# Patient Record
Sex: Male | Born: 1945 | Race: Black or African American | Hispanic: No | Marital: Single | Smoking: Current every day smoker
Health system: Southern US, Community
[De-identification: ages and names within clinical notes are randomized; demographics above are authoritative.]

## PROBLEM LIST (undated history)

## (undated) DIAGNOSIS — K219 Gastro-esophageal reflux disease without esophagitis: Secondary | ICD-10-CM

## (undated) DIAGNOSIS — E785 Hyperlipidemia, unspecified: Secondary | ICD-10-CM

## (undated) DIAGNOSIS — I10 Essential (primary) hypertension: Secondary | ICD-10-CM

## (undated) DIAGNOSIS — I2510 Atherosclerotic heart disease of native coronary artery without angina pectoris: Secondary | ICD-10-CM

## (undated) HISTORY — PX: CHOLECYSTECTOMY: SHX55

## (undated) HISTORY — PX: SOFT TISSUE CYST EXCISION: SHX2418

---

## 2001-08-04 ENCOUNTER — Other Ambulatory Visit: Admission: RE | Admit: 2001-08-04 | Discharge: 2001-08-04 | Payer: Self-pay | Admitting: General Surgery

## 2001-09-21 ENCOUNTER — Other Ambulatory Visit: Admission: RE | Admit: 2001-09-21 | Discharge: 2001-09-21 | Payer: Self-pay | Admitting: General Surgery

## 2003-03-30 DIAGNOSIS — I2510 Atherosclerotic heart disease of native coronary artery without angina pectoris: Secondary | ICD-10-CM

## 2003-03-30 HISTORY — DX: Atherosclerotic heart disease of native coronary artery without angina pectoris: I25.10

## 2005-02-16 ENCOUNTER — Ambulatory Visit (HOSPITAL_COMMUNITY): Admission: RE | Admit: 2005-02-16 | Discharge: 2005-02-16 | Payer: Self-pay | Admitting: Internal Medicine

## 2005-02-16 ENCOUNTER — Encounter (INDEPENDENT_AMBULATORY_CARE_PROVIDER_SITE_OTHER): Payer: Self-pay | Admitting: Internal Medicine

## 2005-02-16 ENCOUNTER — Ambulatory Visit: Payer: Self-pay | Admitting: Internal Medicine

## 2006-03-29 HISTORY — PX: CORONARY ARTERY BYPASS GRAFT: SHX141

## 2006-05-17 ENCOUNTER — Ambulatory Visit: Payer: Self-pay | Admitting: Cardiovascular Disease

## 2006-05-17 ENCOUNTER — Encounter: Payer: Self-pay | Admitting: Cardiology

## 2006-05-17 ENCOUNTER — Encounter: Payer: Self-pay | Admitting: Emergency Medicine

## 2006-05-17 ENCOUNTER — Inpatient Hospital Stay (HOSPITAL_COMMUNITY): Admission: EM | Admit: 2006-05-17 | Discharge: 2006-05-25 | Payer: Self-pay | Admitting: Cardiovascular Disease

## 2006-05-18 ENCOUNTER — Ambulatory Visit: Payer: Self-pay | Admitting: Thoracic Surgery (Cardiothoracic Vascular Surgery)

## 2006-05-18 ENCOUNTER — Encounter: Payer: Self-pay | Admitting: Vascular Surgery

## 2006-06-06 ENCOUNTER — Ambulatory Visit: Payer: Self-pay | Admitting: Cardiology

## 2006-06-21 ENCOUNTER — Encounter (HOSPITAL_COMMUNITY): Admission: RE | Admit: 2006-06-21 | Discharge: 2006-07-21 | Payer: Self-pay | Admitting: Cardiology

## 2006-07-22 ENCOUNTER — Encounter (HOSPITAL_COMMUNITY): Admission: RE | Admit: 2006-07-22 | Discharge: 2006-08-21 | Payer: Self-pay | Admitting: Cardiology

## 2006-08-09 ENCOUNTER — Ambulatory Visit: Payer: Self-pay | Admitting: Cardiology

## 2006-08-23 ENCOUNTER — Encounter (HOSPITAL_COMMUNITY): Admission: RE | Admit: 2006-08-23 | Discharge: 2006-09-22 | Payer: Self-pay | Admitting: Cardiology

## 2007-04-18 ENCOUNTER — Ambulatory Visit: Payer: Self-pay | Admitting: Cardiology

## 2010-08-11 NOTE — Letter (Signed)
Aug 09, 2006    [name], MD  [address]  [place], [place] [address]   RE:  [name], [name]  MRN:  [id number]  /  DOB:  Jan 09, 1946   Dear [name]:   [name] returns to the office now 3 months following CABG surgery.  He  continues to do quite well, achieving good performance in cardiac  rehabilitation.  He reports minimal soreness at the upper part of his  incision.  Otherwise, he has no complaints.  He has been eating well and  gaining weight.   CURRENT MEDICATIONS:  1. Aspirin 81 mg daily.  2. Diazide 37.5/25 mg daily.  3. Pepcid 20 mg daily.  4. Metoprolol 25 mg b.i.d.  5. Atorvastatin 80 mg daily.   EXAMINATION:  Trim, pleasant gentleman in no acute distress.  The weight  is 165, eighteen pounds more than in March.  Blood pressure 135/70,  heart rate 64 with a sinus arrhythmia.  NECK:  No jugular venous distention; no carotid bruits.  LUNGS:  Clear.  THORAX:  Well-healed incision with stable sternum.  CARDIAC:  Normal first and second heart sounds; minimal systolic  ejection murmur.  EXTREMITIES:  No edema.   Laboratory obtained in March was good with mild anemia.  Hemoglobin was  3.5 with a hematocrit of 32.1.  Metabolic profile was normal.  Lipid  profile was excellent with total cholesterol of 99, HDL of 24, and LDL  of 53.   IMPRESSION:  [name] is doing quite well at present.  He is not required  to lift any significant amount of weight at work.  I have cleared him to  return on a full-time basis.  He will continue to refrain from cigarette  smoking and return to see me in 6 months.  We will recheck a chemistry  profile and CBC in 3 months.    Sincerely,      Gerrit Friends. Dietrich Pates, MD, [facility]  Electronically Signed    RMR/MedQ  DD: 08/09/2006  DT: 08/09/2006  Job #: [id number]

## 2010-08-11 NOTE — Letter (Signed)
April 18, 2007    [name], MD  [address]  [place],  [place] [address]   RE:  DIARRA, KOS  MRN:  [id number]  /  DOB:  November 20, 1945   Dear Ninetta Lights:   Mr. Wade returns to the office as scheduled for continued assessment and  treatment of cardiovascular risk factors.  Unfortunately, he has resume  cigarette smoking.  Otherwise, he is taking his appropriate medications  and doing well.  He reports no emergency department visits nor  hospitalizations.  He does experience a mild pulling sensation in his  sternal region with certain movements of his arms, but no chest  discomfort that sounds worrisome.   On exam, pleasant gentleman in no acute distress.  The weight is 157, 8  pounds less than in May.  Afebrile.  HEENT:  Anicteric sclerae; normal  lids and conjunctivae.  NECK:  No jugular venous distention; minimal left carotid bruit.  LUNGS:  Decreased breath sounds at the bases.  CARDIAC:  Normal first and second heart sounds; prominent fourth heart  sound.  ABDOMEN:  Soft and nontender; no organomegaly.  EXTREMITIES:  No edema; distal pulses intact.   No lipid profile is available since the patient's admission for cardiac  surgery.  His last CBC shows some improvement in hemoglobin, but the  level remains depressed at 10.8.  Mr. Cheema is strongly encouraged to once  again discontinue cigarette smoking.  We will check a lipid profile,  chemistry profile and CBC.  If results of those studies are good, I will  plan to see this nice gentleman again in 1 year.  Hypertension is well-  controlled.  He has no evidence for recurrent myocardial ischemia.  LV  systolic function was only mildly depressed, and he has not had  congestive heart failure.    Sincerely,      Gerrit Friends. Dietrich Pates, MD, [facility]  Electronically Signed    RMR/MedQ  DD: 04/18/2007  DT: 04/18/2007  Job #: [id number]

## 2010-08-14 NOTE — Consult Note (Signed)
NAMEZACHARIA, SOWLES NO.:  [id number]   MEDICAL RECORD NO.:  [id number]          PATIENT TYPE:  INP   LOCATION:  2312                         FACILITY:  [facility]   PHYSICIAN:  Zenon Mayo, MDDATE OF BIRTH:  [birth year]   DATE OF CONSULTATION:  05/20/2006  DATE OF DISCHARGE:                                 CONSULTATION   PROCEDURE:  Intraoperative transesophageal echocardiogram.   INDICATIONS:  Left ventricular function.   Mr. Miner is a 65 year old man with a history of hypertension and coronary  artery disease who was brought to the operating room today by Dr. [name]  for coronary artery bypass grafting.  Intraoperative echocardiogram was  requested to evaluate left ventricular function as well as valvular  abnormalities.  The patient was brought to the operating room and placed  under general anesthesia.  After confirmation of endotracheal tube  placement, a transesophageal echo probe was placed into the patient's  esophagus without complication.   The following images were seen:  The left ventricle appeared dilated with global hypokinesis.  The  posterior lateral wall was akinetic.  The ejection fraction was  estimated to be 40-45%.  The mitral valve was imaged next and revealed normal structure to the  leaflets.  They did not appear to be calcified and appeared to coapt  well.  However, when color Doppler was placed across the valve, a  central jet of mitral regurgitation was noted of mild to moderate  nature.  The aortic valve was imaged next and was trileaflet and free of  calcification or vegetations.  The leaflets appeared to move well.  There was no aortic insufficiency noted.  The pulmonic valve was imaged next and looked normal in structure and  function.  The tricuspid valve was also normal in structure and function.  The interatrial septum was intact.   At the conclusion of the procedure, the heart was again imaged.  There  did not appear  to be any differences in cardiac structure or function  from a pre-bypass examination.  The patient came off of the  cardiopulmonary bypass machine without incident.  At the conclusion of  the procedure, the transesophageal echo probe was removed from the  patient's esophagus without evidence of trauma.  The patient was taken  directly from the operating room to the intensive care unit in stable  condition.           ______________________________  Zenon Mayo, MD     WEF/MEDQ  D:  05/20/2006  T:  05/20/2006  Job:  [id number]

## 2010-08-14 NOTE — Letter (Signed)
June 06, 2006    Manuel D. Manuel Shelling, MD  [address]  [place], [place] [address]   RE:  [name], [name]  MRN:  [id number]  /  DOB:  04/01/1945   Dear [name]:   [name] returns to the office following a recent admission to [facility] with unstable angina.  He underwent uncomplicated CABG surgery  and has recovered well since discharge.  He is essentially asymptomatic  at the present time.  He has had a poor appetite and has lost weight.  He has no significant residual chest discomfort.  He has refrained from  cigarette smoking since hospital discharge.   Current medications include aspirin 81 mg daily, Diazide 1 daily, Pepcid  20 mg daily, metoprolol 25 mg b.i.d., Atorvastatin 80 mg daily, Niferex  150 mg daily, oxycodone p.r.n.   On exam, trim, pleasant, soft-spoken gentleman in no acute distress.  The weight is 147, 31 pounds less than in May of 2005, his most recent  office visit.  Blood pressure 105/60, heart rate 80 and regular,  respirations 16.  NECK:  No jugular venous distention; normal carotid upstrokes without  bruits.  LUNGS:  Decreased breath sounds at the bases; otherwise clear.  CARDIAC:  Normal first and second heart sounds; 4th heart sound present.  THORAX:  Stable sternum; incisions are completely closed.  ABDOMEN:  Soft and nontender; no organomegaly.  EXTREMITIES:  Well healed incision; trace edema.   Serum creatinine at discharge was 1.5, back to his baseline.  There had  been a slight transient increase postoperatively.   IMPRESSION:  [name] is doing quite well.  We will refer him to the  cardiac rehabilitation program and complete his disability papers.  I  would anticipate a return to work after a number of months.  Niferex  will be discontinued after his current prescription is completed.  We  will reassess a CBC, chemistry profile, and lipid profile in one month  and adjust his medications accordingly.  Hypertension is under excellent  control.  He is congratulated on refraining from cigarette smoking and  advised to continue to do so.  I will plan to see this nice gentleman  again in 2 months.    Sincerely,      Gerrit Friends. Dietrich Pates, MD, [facility]  Electronically Signed    RMR/MedQ  DD: 06/06/2006  DT: 06/06/2006  Job #: [id number]

## 2010-08-14 NOTE — Op Note (Signed)
NAMENNAMDI, DACUS                  ACCOUNT NO.:  [id number]   MEDICAL RECORD NO.:  [id number]          PATIENT TYPE:  INP   LOCATION:  2312                         FACILITY:  [facility]   PHYSICIAN:  Salvatore Decent. [name], M.D. DATE OF BIRTH:  27-Sep-1945   DATE OF PROCEDURE:  05/20/2006  DATE OF DISCHARGE:                               OPERATIVE REPORT   PREOPERATIVE DIAGNOSIS:  Left main disease with three-vessel coronary  artery disease, status post acute non-Q-wave myocardial infarction.   POSTOPERATIVE DIAGNOSIS:  Left main disease with three-vessel coronary  artery disease, status post acute non-Q-wave myocardial infarction.   PROCEDURE:  Median sternotomy for coronary artery bypass grafting x4  (left internal mammary artery to distal left anterior descending  coronary artery, saphenous vein graft to first diagonal branch,  saphenous vein graft to first circumflex marginal branch, saphenous vein  graft to posterior descending coronary artery, endoscopic saphenous vein  harvest from right thigh and right lower leg).   SURGEON:  Dr. Purcell Nails.   ASSISTANT:  Dr. Kathlee Nations Trigt.   SECOND ASSISTANT:  Constance Holster, PA.   ANESTHESIA:  General.   BRIEF CLINICAL NOTE:  The patient is a 65 year old African American male  from [place], [place], with known history of coronary artery  disease, hypertension, hyperlipidemia and longstanding tobacco abuse.  The patient now presents with progressive symptoms of exertional  shortness of breath and new-onset symptoms of unstable angina.  The  patient initially presented to [facility] with prolonged  episode of chest pain and shortness of breath.  He was admitted to the  hospital and ruled in for an acute non-Q-wave myocardial infarction.  He  was transferred to [facility] where he underwent  cardiac catheterization by Dr. Charlies Constable.  He was found to have  severe left main disease with  three-vessel coronary artery disease.  Echocardiogram reveals mild left ventricular dysfunction.  A full  consultation note has been dictated previously.  The patient has been  counseled at length regarding the indications, risks, and potential  benefits of surgical revascularization.  Alternative treatment  strategies have been discussed in detail.  The patient understands and  accepts all associated risks of surgery and desires to proceed as  described.   OPERATIVE FINDINGS:  1. Mild-to-moderate left ventricular dysfunction with ejection      fraction estimated 40-45%  2. Previous transmural infarction of the posterolateral wall of the      left ventricle.  3. Probable recent inferior wall myocardial infarction.  4. Good-quality left internal mammary artery conduit for grafting.  5. Fair-quality saphenous vein conduit for grafting.  6. Good-quality target vessels for grafting.   OPERATIVE NOTE IN DETAIL:  The patient is brought to the operating room  on the above-mentioned date and central monitoring is established by the  anesthesia service under the care and direction of Dr. Autumn Patty.  Specifically, a Swan-Ganz catheter is placed through the  right internal jugular approach.  A radial arterial line is placed.  Intravenous antibiotics are administered.  Following induction  with  general endotracheal anesthesia, a Foley catheter is placed.  The  patient's chest, abdomen, both groins, and both lower extremities are  prepared and draped in sterile manner.  Baseline transesophageal  echocardiogram is performed by Dr. Sampson Goon.  This demonstrates a  dilated left ventricle with mild left ventricular hypertrophy and  ejection fraction estimated 40-45%.  There is inferior and  posterolateral wall akinesis or severe hypokinesis.  There is mild  mitral regurgitation.  The aortic valve appears normal.   A median sternotomy incision is performed and the left internal mammary   artery is dissected from the chest wall and prepared for bypass  grafting.  The left internal mammary artery is good-quality conduit.  Simultaneously saphenous vein is obtained from the patient's right thigh  and the upper portion of the right lower leg.  The saphenous vein is  slightly small-caliber and somewhat thickened wall and adequate quality  for conduit for grafting.  After the saphenous vein is removed, the  small surgical incision in the right lower extremity is closed in  multiple layers with running absorbable suture.  The patient is  heparinized systemically.  Left internal mammary artery is transected  distally and is noted to have excellent flow.   The pericardium is opened.  The ascending aorta is mildly sclerotic but  otherwise normal in appearance.  The ascending aorta and right atrium  are cannulated for cardiopulmonary bypass.  The retrograde cardioplegic  catheter is placed through the right atrium into the coronary sinus.  Cardiopulmonary bypass is begun and the surface of heart is inspected.  There is mild left ventricular hypertrophy.  There is an old transmural  infarction involving the posterolateral wall of the left ventricle.  This portion of the left ventricular wall is thin, scarred and akinetic.  There is some hyperemia in the inferior wall suggestive of recent  inferior wall myocardial infarction.  Distal sites are selected for  coronary bypass grafting.  A temperature probe is placed in the left  ventricular septum.  A cardioplegic catheter is placed in the ascending  aorta.   The patient is allowed to cool passively to 32 degrees systemic  temperature.  The aortic crossclamp is applied and cold blood  cardioplegia is administered initially in an antegrade fashion through  the aortic root.  Iced saline slush is applied for topical hypothermia.  Supplemental cardioplegia is administered retrograde through the coronary sinus catheter.  The initial  cardioplegic arrest and myocardial  cooling are felt to be excellent.  Repeat doses of cardioplegia are  administered intermittently throughout the crossclamp portion of the  operation through the aortic root, down subsequently placed vein grafts,  and retrograde through the coronary sinus catheter to maintain left  ventricular septal temperature below 15 degrees centigrade.   The following distal coronary anastomoses are performed:  1. The posterior descending coronary artery is grafted with a      saphenous vein graft in end-to-side fashion.  This vessel measures      1.8 mm in diameter and is a good-quality target vessel for      grafting.  2. The first circumflex marginal branch is grafted with a saphenous      vein graft in end-to-side fashion.  This vessel measures 1.5 mm in      diameter and is a fair-quality target vessel for grafting.  Of      note, the distal left circumflex coronary artery is felt not to be  a suitable target for grafting.  It is chronically occluded, small      and sclerotic.  3. The first diagonal branch off the left anterior descending coronary      artery is grafted with a saphenous vein graft in end-to-side      fashion.  This vessel measures 1.8 mm in diameter and is a good-      quality target vessel for grafting.  4. The distal left anterior descending coronary artery is grafted with      the left internal mammary artery in end-to-side fashion.  This      vessel measures 1.5 mm in diameter and is a good-quality target      vessel at the site of distal grafting.   All three proximal saphenous vein anastomoses are performed directly to  the ascending aorta prior to removal of the aortic crossclamp.  The left  ventricular septal temperature rises rapidly with reperfusion of the  left internal mammary artery.  One final dose of warm retrograde hot  shot cardioplegia is administered.  The aortic crossclamp is removed  after a total crossclamp time of  82 minutes.   The heart is defibrillated and normal sinus rhythm resumes.  All  proximal and distal coronary anastomoses are inspected for hemostasis  and appropriate graft orientation.  Epicardial pacing wires are fixed to  the right ventricular outflow tract into the right atrial appendage.  The patient is rewarmed to 37 degrees centigrade temperature.  Low-dose  milrinone infusion is begun.  The patient is weaned from cardiopulmonary  bypass without difficulty.  The patient's rhythm at separation from  bypass is normal sinus rhythm.  Total cardiopulmonary bypass time the  operation is 98 minutes.  Follow-up transesophageal echocardiogram  performed by Dr. Noreene Larsson after separation from bypass demonstrates some  improvement in left ventricular function.  There remains only mild  mitral regurgitation.  No other abnormalities are noted.   The venous and arterial cannulae are removed uneventfully.  Protamine is administered to reverse the anticoagulation.  The mediastinum and left  chest are irrigated with saline solution containing vancomycin.  Meticulous surgical hemostasis is ascertained.  The mediastinum and left  chest are drained with three chest tubes exited through separate stab  incisions inferiorly.  The soft tissues anterior to the aorta are  reapproximated loosely.  The sternum is closed with double-strength  sternal wire.  The soft tissues anterior to the sternum are closed in  multiple layers and the skin is closed with a running subcuticular skin  closure.   The patient tolerated the procedure well and is transported to the  surgical intensive care unit in stable condition.  There are no  intraoperative complications.  All sponge, instrument and needle counts  are verified correct at completion of the operation.  The patient was  transfused one pack of adult platelets due to thrombocytopenia.  No  other blood products were administered.      Salvatore Decent. [name], M.D.   Electronically Signed     CHO/MEDQ  D:  05/20/2006  T:  05/20/2006  Job:  [id number]   cc:   Everardo Beals. Juanda Chance, MD, [facility]  Gerrit Friends. Dietrich Pates, MD, [facility]  [name], MD

## 2010-08-14 NOTE — Consult Note (Signed)
[name], [name]                  ACCOUNT NO.:  [id number]   MEDICAL RECORD NO.:  [id number]          PATIENT TYPE:  INP   LOCATION:  2912                         FACILITY:  [facility]   PHYSICIAN:  Salvatore Decent. [name], M.D. DATE OF BIRTH:  1946/02/05   DATE OF CONSULTATION:  05/18/2006  DATE OF DISCHARGE:                                 CONSULTATION   REQUESTING PHYSICIAN:  Dr. Charlies Constable.   PRIMARY CARE PHYSICIAN:  Dr. Avon Gully.   REASON FOR CONSULTATION:  Severe three-vessel coronary artery disease  with class IV unstable angina.   HISTORY OF PRESENT ILLNESS:  [name] is a 65 year old African American  male from [place] with known history of coronary artery disease as  well as history of hypertension, hyperlipidemia and longstanding tobacco  abuse.  The patient's cardiac history dates back to February 2005 at  which time reportedly he presented with an acute myocardial infarction  complicated by V-fib arrest.  He was successfully resuscitated and  underwent cardiac catheterization at that time that reportedly revealed  100% occlusion of the left circumflex coronary artery as well as 30%  stenosis in the left anterior descending coronary artery and 50%  stenosis in the right coronary artery.  Left ventricular function with  only mildly reduced.  He was treated medically.   [name] reports that he did well until approximately 1 month ago when he  first developed symptoms of exertional shortness of breath.  These  symptoms seem to wax and wane over the last several weeks until 3 days  ago when he developed new onset symptoms of substernal chest pain.  These symptoms also waxed and waned over the ensuing 24 hours until he  developed more severe, unrelenting chest pain on May 17, 2006  prompting presentation to the emergency department at [facility].  The patient's baseline electrocardiogram was without acute ST-  segment elevation and his symptoms of chest pain  were relieved with  intravenous nitroglycerin, morphine, heparin, aspirin and Integrilin.  The patient was admitted and transferred to [facility] for further  management and therapy.  He has not had recurrent symptoms of chest  pain.  He did rule in for a very mild myocardial infarction based on  serial cardiac enzymes.  Specifically, the peak total CK was 264 with a  peak CK-MB fraction of 13.2.  The patient underwent elective cardiac  catheterization today by Dr. Juanda Chance.  He was found to have left main  disease with severe three-vessel coronary artery disease.  Pulmonary  artery pressures were normal.  Left ventricular function was assessed by  echocardiogram performed 1 day previously, and this reportedly revealed  ejection fraction estimated between 50-55%.  Cardiac surgical  consultation has been requested to consider surgical revascularizations.   REVIEW OF SYSTEMS:  GENERAL:  The patient reports normal appetite.  He  has not been gaining or losing weight recently.  He reports normal  energy level.  CARDIAC:  Notable for symptoms described previously.  The  patient reports chronic exertional shortness of breath which accelerated  over the last  month.  The patient denies resting shortness of breath,  PND, orthopnea or lower extremity edema.  The patient denies  palpitations or syncope.  RESPIRATORY:  Notable for exertional shortness  of breath.  The patient also reports recent onset of a dry nonproductive  cough.  He denies productive cough, hemoptysis, wheezing.  GASTROINTESTINAL:  Negative.  The patient reports no difficulty  swallowing.  He denies hematochezia, hematemesis, melena.  MUSCULOSKELETAL:  Negative.  The patient denies arthritis or  arthralgias.  The patient does report occasional tightness in both calf  muscles with ambulation, although this is variable.  The patient denies  resting pain in either lower extremity.  NEUROLOGIC:  Negative.  The  patient denies transient  monocular blindness or transient numbness or  weakness involving either upper or lower extremity.  The patient denies  any history of seizures.  GENITOURINARY:  Negative.  HEENT:  Negative.  The patient is edentulous and wears full set upper and lower dentures.  INFECTIOUS:  Negative.  The patient denies recent fevers or chills.   PAST MEDICAL HISTORY:  1. Coronary artery disease.  2. Hypertension.  3. Hyperlipidemia.  4. Longstanding tobacco abuse.  5. GE reflux disease.  6. Chronic renal insufficiency with baseline creatinine 1.7.  7. Peripheral vascular disease.  8. History of GI bleed during anticoagulation at the time following      his myocardial infarction several years ago.   PAST SURGICAL HISTORY:  1. Cholecystectomy in the distant past.   FAMILY HISTORY:  Noncontributory.  Notable for the absence of premature  coronary artery disease.   SOCIAL HISTORY:  The patient is divorced and lives alone in [place].  He has one son who lives nearby and the patient also has grandchildren.  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  He has longstanding history of tobacco abuse, smoking one  pack of cigarettes per day more than 40 years.  The patient also admits  to regular alcohol consumption and states that he typically drinks a  fifth of liquor every weekend.   MEDICATIONS:  Prior to admission remain unclear.  The patient knows that  he takes aspirin, Lipitor, something for blood pressure and something  for his stomach.   DRUG ALLERGIES:  None known.   PHYSICAL EXAM:  The patient is a well-appearing, thin African American  male who appears somewhat older than stated age in no acute distress.  He is currently in normal sinus rhythm.  Blood pressure 128/68, pulse  89.  He is afebrile.  HEENT exam is grossly unrevealing.  The neck is supple.  There is no cervical nor supraclavicular lymphadenopathy.  There is no jugular venous distension.   No carotid bruits are noted.  Auscultation of the chest reveals clear breath sounds with few  inspiratory crackles.  No wheezes or rhonchi are noted.  Cardiovascular  exam includes regular rate and rhythm.  No murmurs, rubs or gallops are  noted.  The abdomen is flat, soft, nontender.  There are no palpable  masses.  Bowel sounds are present.  The extremities are warm and  adequately perfused.  There is no lower extremity edema.  Distal pulses  are thready and barely palpable in both lower legs in the dorsalis pedis  position.  The skin is dry and scaly but there are no open skin lesions  or ulcerations.  There is no venous insufficiency.  Rectal and GU exams  are both deferred.  Neurologic examination  is grossly nonfocal and  symmetric throughout.   DIAGNOSTIC TEST:  Cardiac catheterization performed by Dr. Juanda Chance today  is reviewed.  This demonstrates left main disease with three-vessel  coronary artery disease.  Specifically, there is 70-80% stenosis of the  distal left main coronary artery.  There is 100% ostial occlusion of the  left circumflex coronary artery.  There are luminal irregularities in  the proximal and mid left anterior descending coronary artery.  There is  late collateral filling of a first circumflex marginal branch and distal  left circumflex vessel, although the quality of these vessels remained  circumspect.  There is right dominant coronary circulation.  There is  50% proximal stenosis of the right coronary artery, 90% stenosis of the  mid right coronary artery.  A 2-D echocardiogram performed May 17, 2006 is reviewed.  This demonstrates mild to moderate left ventricular  hypertrophy.  There is mild left ventricular dysfunction with ejection  fraction estimated at 50-55%.  There is inferior posterolateral wall  akinesis.  There is mild mitral regurgitation.  No other significant  abnormalities are noted.   IMPRESSION:  Left main disease with three-vessel  coronary artery  disease, mild left ventricular dysfunction and new onset unstable  angina.  I believe that [name] would best be treated by elective  coronary artery bypass grafting.  The patient also has multiple other  medical problems as listed.   PLAN:  We tentatively plan to proceed with surgery on Friday May 20, 2006.  We will need to monitor [name] renal function during  meanwhile to make sure that it remains stable post catheterization.  We  will also make sure that he does not reveal any signs suggestive of  alcohol withdrawal.  [name] understands and accepts all associated  risks of surgery including but not limited to risk of death, stroke,  myocardial infarction, congestive heart failure, respiratory failure,  pneumonia, bleeding requiring blood transfusions, arrhythmia, infection  and recurrent coronary artery disease.  He also understands the imperative nature of the fact that he must quit smoking.  All of his  questions have been addressed.      Salvatore Decent. [name], M.D.  Electronically Signed     CHO/MEDQ  D:  05/18/2006  T:  05/19/2006  Job:  [id number]   cc:   Everardo Beals. Juanda Chance, MD, [facility]  Peter C. Eden Emms, MD, [facility]  [name], MD  Gerrit Friends. Dietrich Pates, MD, [facility]  Lionel December, M.D.

## 2010-08-14 NOTE — Cardiovascular Report (Signed)
NAMESHANE, BADEAUX                  ACCOUNT NO.:  [id number]   MEDICAL RECORD NO.:  [id number]          PATIENT TYPE:  INP   LOCATION:  2912                         FACILITY:  [facility]   PHYSICIAN:  Bruce R. 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  He has had a history of remote catheterization but has not had a prior  interventions.  He has a history of hypertension and hyperlipidemia and  smokes.  He also has a history of alcohol use.  He was admitted to the  hospital by Joellyn Rued and Dr. Charlton Haws with chest pain and  positive troponin consistent with a non-ST-elevation myocardial  infarction.   PROCEDURE:  Right heart catheterization was performed percutaneously via  the right femoral vein using a venous sheath and Swan-Ganz  thermodilution catheter.  Left heart catheterization was performed  percutaneously via the right femoral artery using arterial sheath and 6-  [place] preformed coronary catheters.  A frontal wall puncture was  performed, and Omnipaque contrast was used.  We had some difficulty  navigating up the iliac artery due to disease but were finally able to  accomplish this with a Wholey wire.  A distal aortogram was performed to  rule out renal artery stenosis and evaluate for peripheral vascular  disease.  We did not do a left ventriculogram to help save contrast  because of a creatinine of 1.7.   RESULTS:  The left main coronary was free of significant disease.   Left anterior descending artery has 70% stenosis at its ostium.   The circumflex was flush occluded, so it was difficult to be sure if  this was in the left main and in the ostium of the LAD.   The LAD gave rise to three diagonal branches and three septal  perforators.  There was 50/% narrowing in the mid LAD and 70%  narrowing  in the distal LAD.   The circumflex artery had a flush occlusion at its origin.  Distal  circumflex artery filled by collaterals from diagonal branch of the LAD  and consisted of a marginal branch and two posterolateral branches.   The right coronary artery was a moderate-size vessel and gave rise to  conus branch, right ventricular branch, a posterior descending branch,  and a posterolateral branch.  There was 50% narrowing in the proximal  right coronary artery and 90% narrowing in the mid to distal vessel.   No left ventriculogram was performed, but the echocardiogram showed  ejection fraction of 50-55%.   A distal aortogram was performed which showed three renal arteries on  the right and one on the left, all of which were patent.  There was 80%  narrowing in the right common iliac artery.   HEMODYNAMIC DATA:  Right atrial pressure was 8 mean.  The pulmonary  artery pressure was 29/9 with a mean of 17.  The pulmonary wedge  pressure was 8  mean.  Left ventricular pressure was 114/10.  Aortic  pressure was 114/58.  Cardiac output/cardiac index was 4.8/2.6  liters/minute/meter squared.   CONCLUSION:  1. Coronary artery disease with 70% ostial stenosis in the left      anterior descending (LAD), 50% narrowing in the mid LAD, and 70%      narrowing in the distal LAD, total occlusion at the ostium of the      circumflex artery, 50% proximal and 90% mid to distal stenosis in      the right coronary.  2. Ejection fraction 50-55% by echocardiography.  3. 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  (There was no gradient across this      lesion.)   RECOMMENDATIONS:  The patient has severe three-vessel disease and I  think is best treated with surgical revascularization.  His  comorbidities include chronic renal insufficiency, hypertension,  peripheral vascular disease, cigarette use and alcohol use.  I will  obtain CVTS consultation.      Bruce Elvera Lennox Juanda Chance, MD, [facility]  Electronically  Signed     BRB/MEDQ  D:  05/18/2006  T:  05/18/2006  Job:  [id number]   cc:   Gerrit Friends. Dietrich Pates, MD, [facility]  [name], MD  Cardiopulmonary Lab

## 2010-08-14 NOTE — Op Note (Signed)
[name], [name]                  ACCOUNT NO.:  [id number]   MEDICAL RECORD NO.:  [id number]          PATIENT TYPE:  AMB   LOCATION:  DAY                           FACILITY:  [facility]   PHYSICIAN:  Lionel December, M.D.    DATE OF BIRTH:  03-Jul-1945   DATE OF PROCEDURE:  02/16/2005  DATE OF DISCHARGE:                                 OPERATIVE REPORT   PROCEDURE:  Colonoscopy with polypectomy.   INDICATIONS:  [name] is a 65 year old Afro-American male who is here for  screening colonoscopy. Family history is negative for colorectal carcinoma.  Procedure risks were reviewed the patient, and informed consent was  obtained.   PREMEDICATION:  Demerol 50 mg IV, Versed 5 mg IV in divided dose.   FINDINGS:  Procedure performed in endoscopy suite. The patient's vital signs  and O2 saturation were monitored during the procedure and remained stable.  The patient was placed in left lateral position. Rectal examination  performed. No abnormality noted on external or digital exam. Olympus  videoscope was placed in rectum. There were two large polyps at rectosigmoid  junction which were dealt with on the way out. Scope was passed to sigmoid  colon and beyond. Preparation was satisfactory. Scope was advanced to cecum  which was identified by appendiceal orifice and ileocecal valve. Pictures  taken for the record. As the scope was withdrawn, colonic mucosa was  carefully examined. There were two tiny polyps at the hepatic flexure which  were coagulated using snare tip. Third polyp was in the proximal sigmoid  colon. This was also coagulated using snare tip. Attention was then directed  to these large polyps at rectosigmoid. The larger of the two was 15 to 16  mm; another one was small. Both of these were snared, and polypectomy was  felt to be complete. Polyps were retrieved using Dormia basket. Endoscope  was passed again, and rectal mucosa examined which was normal. Scope was  retroflexed to examine  anorectal junction which was unremarkable. Endoscope  was straightened and withdrawn. The patient tolerated the procedure well.   FINAL DIAGNOSIS:  1.  Two large polyp snared from rectosigmoid junction.  2.  Three small polyps were coagulated, two at hepatic flexure and one at      proximal sigmoid colon.   RECOMMENDATIONS:  Standard instructions given.   I will be contacting the patient with biopsy results and further  recommendations.      Lionel December, M.D.  Electronically Signed     NR/MEDQ  D:  02/16/2005  T:  02/16/2005  Job:  [id number]   cc:   [name], MD  Fax: [phone]

## 2010-08-14 NOTE — H&P (Signed)
[name], [name]                  ACCOUNT NO.:  [id number]   MEDICAL RECORD NO.:  [id number]          PATIENT TYPE:  INP   LOCATION:  2912                         FACILITY:  [facility]   PHYSICIAN:  Noralyn Pick. Eden Emms, MD, FACCDATE OF BIRTH:  11-01-1945   DATE OF ADMISSION:  05/17/2006  DATE OF DISCHARGE:                              HISTORY & PHYSICAL   BRIEF HISTORY:  [name] is a 65 year old African American male who was  transferred via [facility] from [facility] to [facility]  CCU for cardiac evaluation secondary to chest discomfort.   [name] has noticed intermittent left arm tightness radiating into his  anterior chest since yesterday.  He has also noticed some sharp pains in  the posterior neck with the anterior chest tightness.  He also describes  shortness of breath.  He denies nausea, vomiting and diaphoresis.  He  describes at least 5 episodes yesterday that lasted less than 5 minutes.  He is not sure of alleviating or aggravating factors and he gives it an  8 on a scale of 0-10.  However, at work last night at approximately 2:00  a.m. 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  He took a sublingual nitroglycerin which reduced it to a 5.  His supervisor had a coworker drive him to the hospital for further  evaluation.  In the emergency room he received albuterol, Atrovent,  aspirin, Toradol, morphine, IV nitroglycerin and heparin and Integrilin  with relief of his discomfort.  He feels his symptoms are similar to an  MI that he had approximately 10 years ago.  However, he states that this  time he has not had to be shocked.   ALLERGIES:  NO KNOWN DRUG ALLERGIES.   MEDICATIONS PRIOR TO ADMISSION:  1. A stomach medication.  2. A blood pressure medication.  3. Lipitor, unknown dose.  4. Aspirin 81 mg daily.   PAST MEDICAL HISTORY:  1. Hyperlipidemia, unknown last  check.  2. Hypertension.  3. GERD.  4. In February 2005, he had a cardiac arrest associated with      ventricular fibrillation for which he had to be shocked 14 times      and intubated.  Catheterization at that time showed a total      circumflex lesion and a 30% to 40% mid distal RCA, a 40% to 50%      proximal LAD, 30% to 40% mid LAD, 20% to 30% left main circumflex      thrombus.  EF was 50% to 55% with inferobasal, posterolateral and      lateral wall akinesis.  Medical therapy was continued.  During that      hospitalization, he also had an upper GI bleed and shock liver      secondary to hyperperfusion.  5. Surgical history if notable for cholecystectomy and a cyst removed      from the forehead.   He denies any history of diabetes, strokes,  COPD, bleeding dyscrasias or  thyroid dysfunction.   SOCIAL HISTORY:  He resides in [place] alone.  He is a Engineer, civil (consulting).  He is single.  He has 1 daughter and 1 grandchild alive and well.  He  smokes 1 pack per day approximately for 40 years.  He drinks a fifth of  whiskey on weekends.  He denies any drug use.  He does not have any  problems with EtOH withdrawal.  He denies herbal medications, diet or  exercise.   FAMILY HISTORY:  Both parents are deceased, mother at 69, father at 58  of uncertain causes.  He has 2 brothers and 4 sisters; as far as he  knows, they are all living and well.   REVIEW OF SYSTEMS:  In addition to above is notable for a rare headache,  sinus drainage, glasses, dentures, chronic dyspnea on exertion and  orthopnea without recent change, claudication in bilateral calves,  difficult for him to quantify distance.  He denies snoring.  All other  systems are unremarkable.   PHYSICAL EXAM:  GENERAL:  A well-nourished, well-developed, pleasant  African American male in no apparent distress.  VITAL SIGNS:  Temperature is 97.5, blood pressure 91/52, pulse 66,  respirations 15 and 100% SATs on 2 L.  HEENT:   Unremarkable except for dentures and glasses.  NECK:  Supple without thyromegaly, adenopathy, JVD or carotid bruits.  CHEST:  Symmetrical excursion, clear to auscultation, however, breath  sounds are significantly decreased.  HEART:  PMI is not displaced.  Regular rate and rhythm.  He does have a  2/6 systolic murmur.  He has 1 to 2+ bilateral femorals with bilateral  bruits.  He has 1+ popliteal, DP and PT bilaterally.  He has a left  upper quadrant bruit as well as a midline abdominal bruit.  SKIN/INTEGUMENT:  Appears to be intact.  ABDOMEN:  Soft.  Bowel sounds present.  Without organomegaly, masses or  tenderness.  EXTREMITIES:  Negative for cyanosis, clubbing or edema.  MUSCULOSKELETAL/NEUROLOGIC:  Grossly unremarkable.   LABORATORY AND ACCESSORY CLINICAL DATA:  Chest x-ray from [facility]  showed cardiomegaly and bibasilar atelectasis.   EKG from [facility] shows normal sinus rhythm, normal axis, normal  intervals with a ventricular rate of 87.  He has an early R-wave, early  repolarization, LVH with repolarization changes.  H&H are 12.8 and 39.8,  normal indices; neutrophils are elevated 89; WBC is 14.4, platelets  109,000.  Sodium 129, potassium 4.0, BUN 23, creatinine 1.7, glucose  151.  BNP was 193, which is slightly elevated.  PTT 39, PT 12.5.  Point-  of-care markers were abnormal in regards to the BNP and was 19.3 and the  troponin 1.52.   IMPRESSION:  1. Acute coronary syndrome, transfer from [facility].  2. Hypertension, improved.  3. Renal insufficiency with unknown baseline.  4. Tobacco use.  5. Alcohol use.  6. Claudication with probable peripheral vascular disease, given      bilateral femoral bruits, decreased pedal pulses and abdominal      bruits.  7. History as noted per past medical history.   DISPOSITION:  Dr. Eden Emms reviewed the patient's history, spoke with and examined the patient and agrees with the above.  The patient will be  admitted to CCU and  continued on his transfer medications.  We will also  had beta blocker, Protonix and Lipitor.  His family is asked to bring in  his home medications.  He will rule out myocardial  infarction.  Given his histories and symptoms, he will undergo cardiac  catheterization today.  He will be pretreated with fluids and sodium  bicarb, given his renal insufficiency and tobacco cessation.  Further  treatment will be pending course.      Joellyn Rued, PA-C      Noralyn Pick. Eden Emms, MD, [facility]  Electronically Signed    EW/MEDQ  D:  05/17/2006  T:  05/17/2006  Job:  [id number]   cc:   Gerrit Friends. Dietrich Pates, MD, [facility]  [name], MD  Lionel December, M.D.

## 2010-08-14 NOTE — Discharge Summary (Signed)
[name], [name]                  ACCOUNT NO.:  [id number]   MEDICAL RECORD NO.:  [id number]          PATIENT TYPE:  INP   LOCATION:  2009                         FACILITY:  [facility]   PHYSICIAN:  Salvatore Decent. [name], M.D. DATE OF BIRTH:  [birth year]   DATE OF ADMISSION:  05/17/2006  DATE OF DISCHARGE:                               DISCHARGE SUMMARY   PRIMARY DIAGNOSIS:  1. Left main disease with three-vessel coronary artery disease status      post acute non-Q-wave myocardial infarction.   IN-HOSPITAL DIAGNOSES:  1. Acute renal insufficiency.  2. Acute blood anemia postoperatively.  3. A 40-60% left internal carotid artery stenosis.  4. 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   SECONDARY DIAGNOSES:  1. Hyperlipidemia.  2. Hypertension.  3. Gastroesophageal reflux disease.  4. Longstanding tobacco abuse.  5. Chronic renal insufficiency with baseline creatinine of 1.7.  6. Peripheral vascular disease.  7. History of gastrointestinal bleed several years ago.   OPERATIONS AND PROCEDURES:  1. Cardiac catheterization.  2. Coronary bypass grafting x4 using a left internal mammary artery to      distal left anterior descending coronary artery, saphenous vein      graft to first diagonal branch, saphenous vein graft to first      circumflex marginal branch, saphenous vein graft to posterior      descending coronary artery.  Endoscopic saphenous vein harvest from      right thigh and right lower leg.   THE PATIENT'S HISTORY AND PHYSICAL AND HOSPITAL COURSE:  The patient is  a 65 year old African-American male with known history of coronary  artery disease, hypertension, hyperlipidemia and longstanding tobacco  abuse.  The patient now presents with progressive symptoms of exertional  shortness of breath and new onset symptoms of unstable angina.  The  patient initially presented to [facility] following the episode  of chest pain and shortness of breath.  He was admitted to  hospital and  was in for an acute non-Q-wave myocardial infarction. The patient was  then transferred to [facility] where he underwent cardiac  catheterization by Dr. Juanda Chance.  He was found to have severe left main  disease with three-vessel coronary artery disease.  Echocardiogram done  revealed mild left ventricular dysfunction.  Dr. Cornelius Lyons was consulted  following catheterization.  He saw and evaluated the patient.  Dr. Cornelius Lyons  discussed with the patient undergoing coronary bypass grafting.  Risks  and benefits were discussed with the patient and family.  The patient  acknowledged understanding and agreed to proceed.  Surgery was scheduled  for May 20, 2006.   Preoperatively, the patient underwent bilateral carotid duplex  ultrasound showing no ICA stenosis on the right with a 40-60% left ICA  stenosis.  Preoperatively, the patient complained of right leg  claudication symptoms.  Angiogram was evaluated by Dr. Excell Seltzer.  There  was an 80% right external iliac stenosis.  Recommended ongoing medical  therapy and monitor.  For details of the patient's past medical history  and physical exam, please see dictated History and Physical.   The  patient was taken to the operating room May 20, 2006, where he  underwent coronary bypass grafting x4 using a left internal mammary  artery to distal left anterior descending coronary artery, saphenous  vein graft to first diagonal branch, saphenous vein graft to first  circumflex marginal branch, saphenous vein graft to posterior descending  coronary artery.  Endoscopic saphenous vein harvesting from right leg  was done.  The patient tolerated this procedure well and was transferred  to the intensive care unit in stable condition.  Postoperatively, the  patient was to be hemodynamically stable.  He was extubated the evening  of surgery.  Following extubation, the patient was noted to be alert and  oriented x4.   Postop day #1, the  patient's vital signs were stable.  Chest x-ray was  stable with no pneumothorax noted.  Chest tubes had minimal output.  Chest tubes and lines were discontinued on postop day #1.  He did  develop acute blood loss anemia.  Hemoglobin and hematocrit dropped to  8.5 and 26, respectively, on postop day #1.  This was monitored over his  hospital course.  By postop day #3, there was increase to 9.9 and 29.2,  respectively.   The patient does have history of chronic renal insufficiency with  baseline creatinine of 1.7.  His creatinine was monitored closely.  It  had increased up to 1.8 postop day #3.  The patient was being treated  with Lasix for volume overload.  On postop day #3, his weight was below  his preoperative weight.  Lasix was discontinued at that time.  Creatinine will be checked in the morning.   The patient was out of bed ambulating well.  He was tolerating regular  diet well.  The patient was transferred up to 2000 postop day #2.  He  did have sinus tachycardia postoperatively.  Beta blocker continued to  be increased, and blood pressure monitored closely.  Other vital signs  were stable.  He remained afebrile.  The patient was able to be weaned  off oxygen, saturating greater than 90% on room air.  The patient's  incisions were clean, dry and intact and healing well.   The patient is felt to be ready for discharge home in the next 1-2 days.   FOLLOWUP APPOINTMENTS:  Followup appointment will be arranged with Dr.  Cornelius Lyons for in 3 weeks.  Our office will contact the patient with this  information.  The patient will need to contact Dr. Regino Schultze office to  schedule followup appointment with him in 2 weeks.   ACTIVITY:  The patient was instructed no driving until released to do  so, no heavy lifting over 10 pounds.  He is told to ambulate three to  four times per day, progress as tolerated and to continue his breathing  exercises.  DIET:  The patient was educated on diet to be  low-fat, low-salt.   INCISIONAL CARE:  The patient is told to shower, washing his incisions  using soap and water.  He is to contact the office if he develops any  drainage or opening from any of his incision sites.   DISCHARGE MEDICATIONS:  1. Aspirin 325 mg daily.  2. Lopressor 50 mg b.i.d.  3. Lipitor 80 mg at night.  4. Protonix 40 mg daily.  5. Oxycodone 5 mg 1-2 tablets q. 4-6 h. p.r.n. pain.      Theda Belfast, PA      Salvatore Decent. [name], M.D.  Electronically Signed  KMD/MEDQ  D:  05/23/2006  T:  05/23/2006  Job:  [id number]   cc:   Everardo Beals. Juanda Chance, MD, [facility]

## 2011-11-10 ENCOUNTER — Encounter (HOSPITAL_COMMUNITY): Payer: Self-pay | Admitting: *Deleted

## 2011-11-10 ENCOUNTER — Emergency Department (HOSPITAL_COMMUNITY): Payer: Medicare Other

## 2011-11-10 ENCOUNTER — Inpatient Hospital Stay (HOSPITAL_COMMUNITY)
Admission: EM | Admit: 2011-11-10 | Discharge: 2011-11-15 | DRG: 811 | Disposition: A | Discharge status: Home / Self Care | Payer: Medicare Other | Attending: Internal Medicine | Admitting: Internal Medicine

## 2011-11-10 DIAGNOSIS — N289 Disorder of kidney and ureter, unspecified: Secondary | ICD-10-CM

## 2011-11-10 DIAGNOSIS — Z7982 Long term (current) use of aspirin: Secondary | ICD-10-CM

## 2011-11-10 DIAGNOSIS — I509 Heart failure, unspecified: Secondary | ICD-10-CM | POA: Diagnosis present

## 2011-11-10 DIAGNOSIS — Z9119 Patient's noncompliance with other medical treatment and regimen: Secondary | ICD-10-CM

## 2011-11-10 DIAGNOSIS — Z91199 Patient's noncompliance with other medical treatment and regimen due to unspecified reason: Secondary | ICD-10-CM

## 2011-11-10 DIAGNOSIS — E785 Hyperlipidemia, unspecified: Secondary | ICD-10-CM | POA: Diagnosis present

## 2011-11-10 DIAGNOSIS — Z951 Presence of aortocoronary bypass graft: Secondary | ICD-10-CM

## 2011-11-10 DIAGNOSIS — D500 Iron deficiency anemia secondary to blood loss (chronic): Principal | ICD-10-CM | POA: Diagnosis present

## 2011-11-10 DIAGNOSIS — I2510 Atherosclerotic heart disease of native coronary artery without angina pectoris: Secondary | ICD-10-CM | POA: Diagnosis present

## 2011-11-10 DIAGNOSIS — K5730 Diverticulosis of large intestine without perforation or abscess without bleeding: Secondary | ICD-10-CM | POA: Diagnosis present

## 2011-11-10 DIAGNOSIS — F17200 Nicotine dependence, unspecified, uncomplicated: Secondary | ICD-10-CM | POA: Diagnosis present

## 2011-11-10 DIAGNOSIS — D649 Anemia, unspecified: Secondary | ICD-10-CM

## 2011-11-10 DIAGNOSIS — E871 Hypo-osmolality and hyponatremia: Secondary | ICD-10-CM | POA: Diagnosis not present

## 2011-11-10 DIAGNOSIS — I5031 Acute diastolic (congestive) heart failure: Secondary | ICD-10-CM

## 2011-11-10 DIAGNOSIS — N183 Chronic kidney disease, stage 3 unspecified: Secondary | ICD-10-CM | POA: Diagnosis present

## 2011-11-10 DIAGNOSIS — K648 Other hemorrhoids: Secondary | ICD-10-CM | POA: Diagnosis present

## 2011-11-10 DIAGNOSIS — Z23 Encounter for immunization: Secondary | ICD-10-CM

## 2011-11-10 DIAGNOSIS — D126 Benign neoplasm of colon, unspecified: Secondary | ICD-10-CM | POA: Diagnosis present

## 2011-11-10 DIAGNOSIS — K219 Gastro-esophageal reflux disease without esophagitis: Secondary | ICD-10-CM | POA: Diagnosis present

## 2011-11-10 DIAGNOSIS — I129 Hypertensive chronic kidney disease with stage 1 through stage 4 chronic kidney disease, or unspecified chronic kidney disease: Secondary | ICD-10-CM | POA: Diagnosis present

## 2011-11-10 DIAGNOSIS — R011 Cardiac murmur, unspecified: Secondary | ICD-10-CM | POA: Diagnosis present

## 2011-11-10 DIAGNOSIS — I252 Old myocardial infarction: Secondary | ICD-10-CM

## 2011-11-10 DIAGNOSIS — D509 Iron deficiency anemia, unspecified: Secondary | ICD-10-CM

## 2011-11-10 DIAGNOSIS — I5041 Acute combined systolic (congestive) and diastolic (congestive) heart failure: Secondary | ICD-10-CM | POA: Diagnosis present

## 2011-11-10 DIAGNOSIS — I10 Essential (primary) hypertension: Secondary | ICD-10-CM | POA: Diagnosis present

## 2011-11-10 HISTORY — DX: Gastro-esophageal reflux disease without esophagitis: K21.9

## 2011-11-10 HISTORY — DX: Atherosclerotic heart disease of native coronary artery without angina pectoris: I25.10

## 2011-11-10 HISTORY — DX: Hyperlipidemia, unspecified: E78.5

## 2011-11-10 HISTORY — DX: Essential (primary) hypertension: I10

## 2011-11-10 LAB — CBC WITH DIFFERENTIAL/PLATELET
Eosinophils Absolute: 0.1 10*3/uL (ref 0.0–0.7)
Eosinophils Relative: 1 % (ref 0–5)
MCH: 15.9 pg — ABNORMAL LOW (ref 26.0–34.0)
MCV: 59.2 fL — ABNORMAL LOW (ref 78.0–100.0)
Monocytes Absolute: 1 10*3/uL (ref 0.1–1.0)
Platelets: 54 10*3/uL — ABNORMAL LOW (ref 150–400)
RDW: 20 % — ABNORMAL HIGH (ref 11.5–15.5)

## 2011-11-10 LAB — COMPREHENSIVE METABOLIC PANEL
ALT: 33 U/L (ref 0–53)
BUN: 14 mg/dL (ref 6–23)
CO2: 21 mEq/L (ref 19–32)
Chloride: 98 mEq/L (ref 96–112)
Creatinine, Ser: 1.54 mg/dL — ABNORMAL HIGH (ref 0.50–1.35)
GFR calc Af Amer: 52 mL/min — ABNORMAL LOW (ref >90)
GFR calc non Af Amer: 45 mL/min — ABNORMAL LOW (ref >90)
Glucose, Bld: 98 mg/dL (ref 70–99)
Potassium: 3.7 mEq/L (ref 3.5–5.1)
Total Bilirubin: 0.9 mg/dL (ref 0.3–1.2)
Total Protein: 7.5 g/dL (ref 6.0–8.3)

## 2011-11-10 LAB — URINALYSIS, ROUTINE W REFLEX MICROSCOPIC
Hgb urine dipstick: NEGATIVE
Nitrite: NEGATIVE
Specific Gravity, Urine: 1.01 (ref 1.005–1.030)
Urobilinogen, UA: 0.2 mg/dL (ref 0.0–1.0)
pH: 5.5 (ref 5.0–8.0)

## 2011-11-10 LAB — RETICULOCYTES
RBC.: 3.94 MIL/uL — ABNORMAL LOW (ref 4.22–5.81)
Retic Count, Absolute: 94.6 10*3/uL (ref 19.0–186.0)

## 2011-11-10 LAB — PROTIME-INR
INR: 1.19 (ref 0.00–1.49)
Prothrombin Time: 15.4 seconds — ABNORMAL HIGH (ref 11.6–15.2)

## 2011-11-10 LAB — LIPASE, BLOOD: Lipase: 50 U/L (ref 11–59)

## 2011-11-10 LAB — PREPARE RBC (CROSSMATCH)

## 2011-11-10 LAB — PRO B NATRIURETIC PEPTIDE: Pro B Natriuretic peptide (BNP): 2149 pg/mL — ABNORMAL HIGH (ref 0–125)

## 2011-11-10 LAB — APTT: aPTT: 39 seconds — ABNORMAL HIGH (ref 24–37)

## 2011-11-10 MED ORDER — POTASSIUM CHLORIDE CRYS ER 20 MEQ PO TBCR
40.0000 meq | EXTENDED_RELEASE_TABLET | Freq: Two times a day (BID) | ORAL | Status: DC
  Administered 2011-11-10 – 2011-11-12 (×4): 40 meq via ORAL
  Filled 2011-11-10 (×3): qty 2
  Filled 2011-11-10: qty 1

## 2011-11-10 MED ORDER — FUROSEMIDE 10 MG/ML IJ SOLN
40.0000 mg | Freq: Once | INTRAMUSCULAR | Status: AC
  Administered 2011-11-10: 40 mg via INTRAVENOUS
  Filled 2011-11-10: qty 4

## 2011-11-10 MED ORDER — SODIUM CHLORIDE 0.9 % IV SOLN
INTRAVENOUS | Status: DC
  Administered 2011-11-10 – 2011-11-11 (×3): via INTRAVENOUS

## 2011-11-10 MED ORDER — ASPIRIN 81 MG PO CHEW
81.0000 mg | CHEWABLE_TABLET | Freq: Every day | ORAL | Status: DC
  Administered 2011-11-10 – 2011-11-13 (×4): 81 mg via ORAL
  Filled 2011-11-10 (×4): qty 1

## 2011-11-10 MED ORDER — FUROSEMIDE 10 MG/ML IJ SOLN
40.0000 mg | Freq: Two times a day (BID) | INTRAMUSCULAR | Status: DC
  Administered 2011-11-11 (×2): 40 mg via INTRAVENOUS
  Filled 2011-11-10 (×2): qty 4

## 2011-11-10 MED ORDER — FUROSEMIDE 10 MG/ML IJ SOLN
60.0000 mg | Freq: Once | INTRAMUSCULAR | Status: AC
  Administered 2011-11-10: 60 mg via INTRAVENOUS
  Filled 2011-11-10: qty 6

## 2011-11-10 MED ORDER — CARVEDILOL 3.125 MG PO TABS
6.2500 mg | ORAL_TABLET | Freq: Two times a day (BID) | ORAL | Status: DC
  Administered 2011-11-11 – 2011-11-15 (×7): 6.25 mg via ORAL
  Filled 2011-11-10 (×9): qty 2

## 2011-11-10 MED ORDER — PNEUMOCOCCAL VAC POLYVALENT 25 MCG/0.5ML IJ INJ
0.5000 mL | INJECTION | INTRAMUSCULAR | Status: AC
  Filled 2011-11-10: qty 0.5

## 2011-11-10 NOTE — ED Notes (Addendum)
Pt received a call from doctor's office today stating his hemoglobin was low. Pt also states sharp, intermittent abdominal and lower back pain x 2 days.

## 2011-11-10 NOTE — ED Provider Notes (Cosign Needed)
History    This chart was scribed for Ward Givens, MD, MD by Smitty Pluck. The patient was seen in room APA04 and the patient's care was started at 3:15PM.   CSN: [id number]  Arrival date & time 11/10/11  1439   First MD Initiated Contact with Patient 11/10/11 1505      Chief Complaint  Patient presents with  . low hemaglobin     (Consider location/radiation/quality/duration/timing/severity/associated sxs/prior treatment) The history is provided by the patient.   [name] is a 66 y.o. male who presents to the Emergency Department complaining of low hemoglobin of 5.7 one day ago at Dr. Letitia Neri office drawn at a routine appointment. Pt has creatine of 1.6. Pt reports that he has small amount of blood in stool intermittently onset 2 days ago, feeling tired and having SOB onset 2 days ago. Dyspnea is with exertion. He states that he has intermittent hematuria. Denies dysuria. Reports having increased frequency. Pt reports having swelling in bilateral legs also in the past couple of days.  PT denies feeling light headed or weakness, but states he does feel tired. He denies chest pain.   Pt has hx of ulcers, gout and triple bypass. Pt takes asa 81 mg daily and denies taking any blood thinners. Pt reports smoking 1 packs/day and drinking alcohol on the weekends.   PCP Dr Phillips Odor Cardiologist is Dr. Dietrich Pates   Past Medical History  Diagnosis Date  . Coronary artery disease   . Hypertension   . Hypercholesterolemia     Past Surgical History  Procedure Date  . Coronary artery bypass graft     No family history on file.  History  Substance Use Topics  . Smoking status: Current Everyday Smoker  . Smokeless tobacco: Not on file  . Alcohol Use: Yes     Occ  lives with spouse Retired but works part-time drinks a fifth on weekends  Review of Systems  Genitourinary: Positive for frequency and hematuria. Negative for dysuria and difficulty urinating.  All other systems reviewed  and are negative.  10 Systems reviewed and all are negative for acute change except as noted in the HPI.    Allergies  Review of patient's allergies indicates no known allergies.  Home Medications   Current Outpatient Rx  Name Route Sig Dispense Refill  . FUROSEMIDE 40 MG PO TABS Oral Take 40 mg by mouth daily.    Marland Kitchen POTASSIUM CHLORIDE CRYS ER 20 MEQ PO TBCR Oral Take 20 mEq by mouth daily.      BP 137/67  Pulse 93  Temp 98.2 F (36.8 C) (Oral)  Resp 16  Ht 5\' 8"  (1.727 m)  Wt 163 lb (73.936 kg)  BMI 24.78 kg/m2  SpO2 100%  Vital signs normal    Physical Exam  Nursing note and vitals reviewed. Constitutional: He is oriented to person, place, and time. He appears well-developed and well-nourished.  Non-toxic appearance. He does not appear ill. No distress.  HENT:  Head: Normocephalic and atraumatic.  Right Ear: External ear normal.  Left Ear: External ear normal.  Nose: Nose normal. No mucosal edema or rhinorrhea.  Mouth/Throat: Oropharynx is clear and moist and mucous membranes are normal. No dental abscesses or uvula swelling.  Eyes: Conjunctivae and EOM are normal. Pupils are equal, round, and reactive to light.       Pale sclera   Neck: Normal range of motion and full passive range of motion without pain. Neck supple.  Cardiovascular: Normal rate  and regular rhythm.  Exam reveals no gallop and no friction rub.   Murmur heard.      Harsh blowing systolic murmur Left and right lower sternal border Faint in left upper sternal border   Pulmonary/Chest: Effort normal and breath sounds normal. No respiratory distress. He has no wheezes. He has no rhonchi. He has no rales. He exhibits no tenderness and no crepitus.  Abdominal: Soft. Normal appearance and bowel sounds are normal. He exhibits no distension. There is tenderness (mildly tender diffusly in lower abdomen wihtout localization). There is no rebound and no guarding.  Genitourinary:       Stool was yellow-brown  color No hemorrhoids  Hem neg stool     Musculoskeletal: Normal range of motion. He exhibits no edema and no tenderness.       +1-2 pitting edema below the knees bilaterally  No edema above the knees bilaterally   Neurological: He is alert and oriented to person, place, and time. He has normal strength. No cranial nerve deficit.  Skin: Skin is warm, dry and intact. No rash noted. No erythema. No pallor.  Psychiatric: He has a normal mood and affect. His speech is normal and behavior is normal. His mood appears not anxious.    ED Course  Procedures (including critical care time)   Medications  0.9 %  sodium chloride infusion (  Intravenous New Bag/Given 11/10/11 1707)  furosemide (LASIX) injection 60 mg (60 mg Intravenous Given 11/10/11 1707)      DATE OF CONSULTATION: 05/20/2006  DATE OF DISCHARGE:  CONSULTATION  PROCEDURE: Intraoperative transesophageal echocardiogram.  INDICATIONS: Left ventricular function.  [name] is a 66 year old man with a history of hypertension and coronary  artery disease who was brought to the operating room today by Dr. [name]  for coronary artery bypass grafting. Intraoperative echocardiogram was  requested to evaluate left ventricular function as well as valvular  abnormalities. The patient was brought to the operating room and placed  under general anesthesia. After confirmation of endotracheal tube  placement, a transesophageal echo probe was placed into the patient's  esophagus without complication.  The following images were seen:  The left ventricle appeared dilated with global hypokinesis. The  posterior lateral wall was akinetic. The ejection fraction was  estimated to be 40-45%.  The mitral valve was imaged next and revealed normal structure to the  leaflets. They did not appear to be calcified and appeared to coapt  well. However, when color Doppler was placed across the valve, a  central jet of mitral regurgitation was noted of mild to  moderate  nature.  The aortic valve was imaged next and was trileaflet and free of  calcification or vegetations. The leaflets appeared to move well.  There was no aortic insufficiency noted.  The pulmonic valve was imaged next and looked normal in structure and  function.  The tricuspid valve was also normal in structure and function.  The interatrial septum was intact.  At the conclusion of the procedure, the heart was again imaged. There  did not appear to be any differences in cardiac structure or function  from a pre-bypass examination. The patient came off of the  cardiopulmonary bypass machine without incident. At the conclusion of  the procedure, the transesophageal echo probe was removed from the  patient's esophagus without evidence of trauma. The patient was taken  directly from the operating room to the intensive care unit in stable  condition.  ______________________________  Zenon Mayo, MD  DIAGNOSTIC STUDIES: Oxygen Saturation is 100% on room air, normal by my interpretation.    COORDINATION OF CARE: 3:25PM EDP discusses pt ED treatment with pt  3:25PM EDP ordered medication: Scheduled Meds:    . furosemide  60 mg Intravenous Once   Continuous Infusions:  Patient .given Lasix for his congestive heart failure. He was prepared to have blood transfusions. Two units of PRC's were ordered  17:21 Dr Felecia Shelling, do anemia profile, do temp orders.   Results for orders placed during the hospital encounter of 11/10/11  CBC WITH DIFFERENTIAL      Component Value Range   WBC 9.2  4.0 - 10.5 K/uL   RBC 3.95 (*) 4.22 - 5.81 MIL/uL   Hemoglobin 6.3 (*) 13.0 - 17.0 g/dL   HCT 06.3 (*) 01.6 - 01.0 %   MCV 59.2 (*) 78.0 - 100.0 fL   MCH 15.9 (*) 26.0 - 34.0 pg   MCHC 26.9 (*) 30.0 - 36.0 g/dL   RDW 93.2 (*) 35.5 - 73.2 %   Platelets 54 (*) 150 - 400 K/uL   Neutrophils Relative 67  43 - 77 %   Lymphocytes Relative 20  12 - 46 %   Monocytes Relative 11  3 - 12 %    Eosinophils Relative 1  0 - 5 %   Basophils Relative 1  0 - 1 %   Neutro Abs 6.2  1.7 - 7.7 K/uL   Lymphs Abs 1.8  0.7 - 4.0 K/uL   Monocytes Absolute 1.0  0.1 - 1.0 K/uL   Eosinophils Absolute 0.1  0.0 - 0.7 K/uL   Basophils Absolute 0.1  0.0 - 0.1 K/uL   RBC Morphology POLYCHROMASIA PRESENT     Smear Review LARGE PLATELETS PRESENT    SAMPLE TO BLOOD BANK      Component Value Range   Blood Bank Specimen SAMPLE AVAILABLE FOR TESTING     Sample Expiration 11/13/2011    COMPREHENSIVE METABOLIC PANEL      Component Value Range   Sodium 130 (*) 135 - 145 mEq/L   Potassium 3.7  3.5 - 5.1 mEq/L   Chloride 98  96 - 112 mEq/L   CO2 21  19 - 32 mEq/L   Glucose, Bld 98  70 - 99 mg/dL   BUN 14  6 - 23 mg/dL   Creatinine, Ser 2.02 (*) 0.50 - 1.35 mg/dL   Calcium 9.4  8.4 - 54.2 mg/dL   Total Protein 7.5  6.0 - 8.3 g/dL   Albumin 3.4 (*) 3.5 - 5.2 g/dL   AST 23  0 - 37 U/L   ALT 33  0 - 53 U/L   Alkaline Phosphatase 83  39 - 117 U/L   Total Bilirubin 0.9  0.3 - 1.2 mg/dL   GFR calc non Af Amer 45 (*) >90 mL/min   GFR calc Af Amer 52 (*) >90 mL/min  APTT      Component Value Range   aPTT 39 (*) 24 - 37 seconds  PROTIME-INR      Component Value Range   Prothrombin Time 15.4 (*) 11.6 - 15.2 seconds   INR 1.19  0.00 - 1.49  URINALYSIS, ROUTINE W REFLEX MICROSCOPIC      Component Value Range   Color, Urine YELLOW  YELLOW   APPearance CLEAR  CLEAR   Specific Gravity, Urine 1.010  1.005 - 1.030   pH 5.5  5.0 - 8.0   Glucose, UA NEGATIVE  NEGATIVE mg/dL  Hgb urine dipstick NEGATIVE  NEGATIVE   Bilirubin Urine NEGATIVE  NEGATIVE   Ketones, ur NEGATIVE  NEGATIVE mg/dL   Protein, ur NEGATIVE  NEGATIVE mg/dL   Urobilinogen, UA 0.2  0.0 - 1.0 mg/dL   Nitrite NEGATIVE  NEGATIVE   Leukocytes, UA NEGATIVE  NEGATIVE  PRO B NATRIURETIC PEPTIDE      Component Value Range   Pro B Natriuretic peptide (BNP) 2149.0 (*) 0 - 125 pg/mL  LIPASE, BLOOD      Component Value Range   Lipase 50  11 -  59 U/L  RETICULOCYTES      Component Value Range   Retic Ct Pct 2.4  0.4 - 3.1 %   RBC. 3.94 (*) 4.22 - 5.81 MIL/uL   Retic Count, Manual 94.6  19.0 - 186.0 K/uL  TYPE AND SCREEN      Component Value Range   ABO/RH(D) A POS     Antibody Screen PENDING     Sample Expiration 11/13/2011     Laboratory interpretation all normal except anemia, elevated BNP, renal insuffic    Dg Chest Portable 1 View  11/10/2011  *RADIOLOGY REPORT*  Clinical Data: Shortness of breath.  Pedal edema.  Heart murmur.  PORTABLE CHEST - 1 VIEW 11/10/2011 1543 hours:  Comparison: Two-view chest x-ray 05/23/2006.  Portable chest x-rays 05/22/2006 dating back to 05/17/1006.  Findings: Prior sternotomy CABG.  Cardiac silhouette markedly enlarged, with interval increase in the heart size, even allowing for differences in technique.  Diffuse interstitial pulmonary edema superimposed upon emphysematous changes in the upper lobes, left greater than right.  Bilateral pleural effusions, right greater than left, with associated consolidation in the right lower lobe.  IMPRESSION: CHF, with marked cardiomegaly and moderate diffuse interstitial pulmonary edema, superimposed upon COPD/emphysema.  Bilateral pleural effusions, right greater than left, with associated passive atelectasis in the right lower lobe.  Original Report Authenticated By: Arnell Sieving, M.D.    Date: 11/10/2011  Rate: 92  Rhythm: normal sinus rhythm  QRS Axis: normal  Intervals: normal  ST/T Wave abnormalities: nonspecific T wave changes  Conduction Disutrbances:LVH  Narrative Interpretation:   Old EKG Reviewed: changes noted from 05/21/2006 did have inferolateral ischemia    1. Anemia   2. Congestive heart disease   3. Heart murmur, systolic   4. Renal insufficiency    Plan admission  CRITICAL CARE Performed by: Devoria Albe L   Total critical care time:33 min  Critical care time was exclusive of separately billable procedures and treating  other patients.  Critical care was necessary to treat or prevent imminent or life-threatening deterioration.  Critical care was time spent personally by me on the following activities: development of treatment plan with patient and/or surrogate as well as nursing, discussions with consultants, evaluation of patient's response to treatment, examination of patient, obtaining history from patient or surrogate, ordering and performing treatments and interventions, ordering and review of laboratory studies, ordering and review of radiographic studies, pulse oximetry and re-evaluation of patient's condition.    MDM    I personally performed the services described in this documentation, which was scribed in my presence. The recorded information has been reviewed and considered.  Devoria Albe, MD, [facility]    Ward Givens, MD 11/10/11 [phone]

## 2011-11-11 ENCOUNTER — Encounter (HOSPITAL_COMMUNITY): Payer: Self-pay | Admitting: Cardiology

## 2011-11-11 DIAGNOSIS — I5031 Acute diastolic (congestive) heart failure: Secondary | ICD-10-CM

## 2011-11-11 DIAGNOSIS — D509 Iron deficiency anemia, unspecified: Secondary | ICD-10-CM

## 2011-11-11 DIAGNOSIS — I10 Essential (primary) hypertension: Secondary | ICD-10-CM | POA: Diagnosis present

## 2011-11-11 DIAGNOSIS — E785 Hyperlipidemia, unspecified: Secondary | ICD-10-CM | POA: Diagnosis present

## 2011-11-11 DIAGNOSIS — I7090 Unspecified atherosclerosis: Secondary | ICD-10-CM

## 2011-11-11 DIAGNOSIS — I517 Cardiomegaly: Secondary | ICD-10-CM

## 2011-11-11 DIAGNOSIS — D649 Anemia, unspecified: Secondary | ICD-10-CM

## 2011-11-11 DIAGNOSIS — K219 Gastro-esophageal reflux disease without esophagitis: Secondary | ICD-10-CM | POA: Insufficient documentation

## 2011-11-11 DIAGNOSIS — I2510 Atherosclerotic heart disease of native coronary artery without angina pectoris: Secondary | ICD-10-CM | POA: Diagnosis present

## 2011-11-11 DIAGNOSIS — I509 Heart failure, unspecified: Secondary | ICD-10-CM

## 2011-11-11 LAB — TYPE AND SCREEN
ABO/RH(D): A POS
Antibody Screen: NEGATIVE
Unit division: 0

## 2011-11-11 LAB — IRON AND TIBC
Iron: 11 ug/dL — ABNORMAL LOW (ref 42–135)
UIBC: 447 ug/dL — ABNORMAL HIGH (ref 125–400)

## 2011-11-11 LAB — FOLATE: Folate: 7.9 ng/mL

## 2011-11-11 LAB — BASIC METABOLIC PANEL
BUN: 17 mg/dL (ref 6–23)
Creatinine, Ser: 1.55 mg/dL — ABNORMAL HIGH (ref 0.50–1.35)
GFR calc Af Amer: 52 mL/min — ABNORMAL LOW (ref >90)
GFR calc non Af Amer: 45 mL/min — ABNORMAL LOW (ref >90)
Glucose, Bld: 98 mg/dL (ref 70–99)
Potassium: 4 mEq/L (ref 3.5–5.1)

## 2011-11-11 LAB — CBC
Hemoglobin: 8.3 g/dL — ABNORMAL LOW (ref 13.0–17.0)
MCH: 18.6 pg — ABNORMAL LOW (ref 26.0–34.0)
MCHC: 29.9 g/dL — ABNORMAL LOW (ref 30.0–36.0)
RDW: 24.7 % — ABNORMAL HIGH (ref 11.5–15.5)

## 2011-11-11 LAB — LIPID PANEL
Cholesterol: 99 mg/dL (ref 0–200)
VLDL: 11 mg/dL (ref 0–40)

## 2011-11-11 LAB — VITAMIN B12: Vitamin B-12: 526 pg/mL (ref 211–911)

## 2011-11-11 LAB — TROPONIN I: Troponin I: <0.3 ng/mL (ref <0.30)

## 2011-11-11 MED ORDER — FUROSEMIDE 10 MG/ML IJ SOLN: 20.0000 mg | Freq: Two times a day (BID) | INTRAMUSCULAR | Status: DC

## 2011-11-11 MED ORDER — FUROSEMIDE 10 MG/ML IJ SOLN
40.0000 mg | Freq: Three times a day (TID) | INTRAMUSCULAR | Status: DC
  Administered 2011-11-11 – 2011-11-12 (×2): 40 mg via INTRAVENOUS
  Filled 2011-11-11 (×2): qty 4

## 2011-11-11 MED ORDER — ACETAMINOPHEN 500 MG PO TABS
500.0000 mg | ORAL_TABLET | Freq: Four times a day (QID) | ORAL | Status: DC | PRN
  Administered 2011-11-11: 500 mg via ORAL
  Filled 2011-11-11: qty 1

## 2011-11-11 MED ORDER — FERROUS SULFATE 325 (65 FE) MG PO TABS
325.0000 mg | ORAL_TABLET | Freq: Two times a day (BID) | ORAL | Status: DC
  Administered 2011-11-11 – 2011-11-12 (×2): 325 mg via ORAL
  Filled 2011-11-11 (×2): qty 1

## 2011-11-11 MED ORDER — PANTOPRAZOLE SODIUM 40 MG IV SOLR
40.0000 mg | INTRAVENOUS | Status: DC
  Administered 2011-11-11 – 2011-11-13 (×3): 40 mg via INTRAVENOUS
  Filled 2011-11-11 (×3): qty 40

## 2011-11-11 NOTE — Clinical Social Work Note (Signed)
CSW received referral for issues obtaining medications. CM aware and to follow.  Derenda Fennel, [place] [phone]

## 2011-11-11 NOTE — Care Management Note (Signed)
    Page 1 of 1   11/15/2011     9:57:25 AM   CARE MANAGEMENT NOTE 11/15/2011  Patient:  [name], [name]   Account Number:  [id number]  Date Initiated:  11/11/2011  Documentation initiated by:  Sharrie Rothman  Subjective/Objective Assessment:   Pt admitted from home with anemia and CHF. Pt lives with a friend and will return home at discharge. Pt is independent with ADL's. No DME or HH currently.     Action/Plan:   Pt requested help with medication at discharge. Encouraged pt to contact Social Security at discharge and sign up for a prescription drug plan. Also encouraged pt to get meds from [facility] off the $4 list if possible.   Anticipated DC Date:  11/13/2011   Anticipated DC Plan:  HOME/SELF CARE      DC Planning Services  CM consult      Choice offered to / List presented to:             Status of service:  Completed, signed off Medicare Important Message given?   (If response is "NO", the following Medicare IM given date fields will be blank) Date Medicare IM given:   Date Additional Medicare IM given:    Discharge Disposition:  HOME/SELF CARE  Per UR Regulation:    If discussed at Long Length of Stay Meetings, dates discussed:    Comments:  11/15/11 0954 Arlyss Queen, RN BSN CM Pt discharged home today. CM called [facility] for pt to price discharge meds. Pt stated that he could afford $58.00 for all meds. CM reinforced to pt to check with Social Security to sign up for drug plan to help with medications. No HH needs. Pt and pts nurse aware of discharge arrangements.  11/11/11 1142 Arlyss Queen, RN BSN CM

## 2011-11-11 NOTE — Progress Notes (Signed)
Subjective: Patient feels better. His breathing is improving. No chest pain, nausea or vomiting. No hematemesis or ma lena.  Objective: Vital signs in last 24 hours: Temp:  [97.5 F (36.4 C)-98.5 F (36.9 C)] 98.2 F (36.8 C) (08/15 0816) Pulse Rate:  [91-104] 94  (08/15 0816) Resp:  [16-20] 20  (08/15 0816) BP: (129-167)/(67-86) 139/71 mmHg (08/15 0816) SpO2:  [94 %-100 %] 97 % (08/15 0816) Weight:  [64.955 kg (143 lb 3.2 oz)-73.936 kg (163 lb)] 64.955 kg (143 lb 3.2 oz) (08/15 0444) Weight change:  Last BM Date: 11/09/11  Intake/Output from previous day: 08/14 0701 - 08/15 0700 In: 1118.3 [P.O.:480; Blood:638.3] Out: 6650 [Urine:6650]  PHYSICAL EXAM General appearance: alert and no distress Resp: clear to auscultation bilaterally Cardio: S1, S2 normal and systolic murmur: systolic ejection 4/6, blowing at apex GI: soft, non-tender; bowel sounds normal; no masses,  no organomegaly Extremities: 3+  Lab Results:    @labtest @ [facility] No results found for this basename: PHART,PCO2,PO2ART,TCO2,HCO3 in the last 72 hours CULTURES No results found for this or any previous visit (from the past 240 hour(s)). Studies/Results: Dg Chest Portable 1 View  11/10/2011  *RADIOLOGY REPORT*  Clinical Data: Shortness of breath.  Pedal edema.  Heart murmur.  PORTABLE CHEST - 1 VIEW 11/10/2011 1543 hours:  Comparison: Two-view chest x-ray 05/23/2006.  Portable chest x-rays 05/22/2006 dating back to 05/17/1006.  Findings: Prior sternotomy CABG.  Cardiac silhouette markedly enlarged, with interval increase in the heart size, even allowing for differences in technique.  Diffuse interstitial pulmonary edema superimposed upon emphysematous changes in the upper lobes, left greater than right.  Bilateral pleural effusions, right greater than left, with associated consolidation in the right lower lobe.  IMPRESSION: CHF, with marked cardiomegaly and moderate diffuse interstitial pulmonary edema, superimposed  upon COPD/emphysema.  Bilateral pleural effusions, right greater than left, with associated passive atelectasis in the right lower lobe.  Original Report Authenticated By: Arnell Sieving, M.D.    Medications: I have reviewed the patient's current medications.  Assesment: 1. Severe anemia probably secondary to chronic blood loos  2. Acute systolic CHF  3. H/O CAD and S/p MI  4. S/P CABG  5. Medical non complains  6. Tobacco smoking  7.Hyperlipedemia  8. H/O GERD  Active Problems:  * No active hospital problems. *     Plan: Continue Iv diuretics We will start on PPI and Iron Echo pending Cardiology and GI consult    LOS: 1 day   [name] 11/11/2011, 8:19 AM

## 2011-11-11 NOTE — Progress Notes (Signed)
*  PRELIMINARY RESULTS* Echocardiogram 2D Echocardiogram has been performed.  Caswell Corwin 11/11/2011, 9:22 AM

## 2011-11-11 NOTE — Consult Note (Signed)
Reason for Consult:Anemia Referring Physician: Jaevion Goto is an 66 y.o. male.  HPI: Admitted yesterday thru the emergency dept for SOB. Admitted with CHF and anemia. He has received 2 units of PRBCs. He tells me he had been SOB x 3-4 days. He denies prior hx of anemia.   He tells me he has lost about 12 pounds over the past 2 months. His appetite has not changed. He denies acid reflux/heartburn. He says he has had mild abdominal pain x 2 days. His stools are brown. He denies any bright red rectal bleeding or melena.  He says some of his stools are normal size and some are small. He has a BM every other day.  Colonoscopy in 2006. He tells me he did not follow up on colonoscopy.  Hemoglobin today 8.6.  He underwent a colonoscopy in 2006 by Dr. Karilyn Cota which revealed 1. Two large polyp snared from rectosigmoid junction.  2. Three small polyps were coagulated, two at hepatic flexure and one at  proximal sigmoid colon.  Biopsy:  DIAGNOSIS: 1) polyp (rectal sigmoid, polypectomy): TUBULOVILLOUS ADENOMA. STALK MARGIN APPEARS CLEAR. 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 NO INVASIVE TUMOR IDENTIFIED. SOME BENIGN ADENOMATOUS CHANGE APPROACHES CLOSE TO THE STALK MARGIN.   Past Medical History  Diagnosis Date  . Coronary artery disease   . Hypertension   . Hypercholesterolemia     Past Surgical History  Procedure Date  . Coronary artery bypass graft     No family history on file.  Social History:  reports that he has been smoking.  He does not have any smokeless tobacco history on file. He reports that he drinks alcohol. His drug history not on file.  Allergies: No Known Allergies  Medications: I have reviewed the patient's current medications.  Results for orders placed during the hospital encounter of 11/10/11 (from the past 48 hour(s))  CBC WITH DIFFERENTIAL     Status: Abnormal   Collection Time   11/10/11  3:27 PM      Component Value Range Comment   WBC 9.2  4.0 - 10.5 K/uL    RBC 3.95 (*) 4.22 - 5.81 MIL/uL    Hemoglobin 6.3 (*) 13.0 - 17.0 g/dL    HCT 29.5 (*) 62.1 - 52.0 %    MCV 59.2 (*) 78.0 - 100.0 fL    MCH 15.9 (*) 26.0 - 34.0 pg    MCHC 26.9 (*) 30.0 - 36.0 g/dL    RDW 30.8 (*) 65.7 - 15.5 %    Platelets 54 (*) 150 - 400 K/uL    Neutrophils Relative 67  43 - 77 %    Lymphocytes Relative 20  12 - 46 %    Monocytes Relative 11  3 - 12 %    Eosinophils Relative 1  0 - 5 %    Basophils Relative 1  0 - 1 %    Neutro Abs 6.2  1.7 - 7.7 K/uL    Lymphs Abs 1.8  0.7 - 4.0 K/uL    Monocytes Absolute 1.0  0.1 - 1.0 K/uL    Eosinophils Absolute 0.1  0.0 - 0.7 K/uL    Basophils Absolute 0.1  0.0 - 0.1 K/uL    RBC Morphology POLYCHROMASIA PRESENT      Smear Review LARGE PLATELETS PRESENT   GIANT PLATELETS SEEN  COMPREHENSIVE METABOLIC PANEL     Status: Abnormal   Collection Time   11/10/11  3:27 PM      Component Value Range Comment   Sodium 130 (*) 135 - 145 mEq/L    Potassium 3.7  3.5 - 5.1 mEq/L    Chloride 98  96 - 112 mEq/L    CO2 21  19 - 32 mEq/L    Glucose, Bld 98  70 - 99 mg/dL    BUN 14  6 - 23 mg/dL    Creatinine, Ser 0.45 (*) 0.50 - 1.35 mg/dL    Calcium 9.4  8.4 - 40.9 mg/dL    Total Protein 7.5  6.0 - 8.3 g/dL    Albumin 3.4 (*) 3.5 - 5.2 g/dL    AST 23  0 - 37 U/L    ALT 33  0 - 53 U/L    Alkaline Phosphatase 83  39 - 117 U/L    Total Bilirubin 0.9  0.3 - 1.2 mg/dL    GFR calc non Af Amer 45 (*) >90 mL/min    GFR calc Af Amer 52 (*) >90 mL/min   APTT     Status: Abnormal   Collection Time   11/10/11  3:27 PM      Component Value Range Comment   aPTT 39 (*) 24 - 37 seconds   PROTIME-INR     Status: Abnormal   Collection Time   11/10/11  3:27 PM      Component Value Range Comment   Prothrombin Time 15.4 (*) 11.6 - 15.2 seconds    INR 1.19  0.00 - 1.49   PRO B NATRIURETIC PEPTIDE     Status: Abnormal   Collection Time   11/10/11  3:27 PM       Component Value Range Comment   Pro B Natriuretic peptide (BNP) 2149.0 (*) 0 - 125 pg/mL   LIPASE, BLOOD     Status: Normal   Collection Time   11/10/11  3:27 PM      Component Value Range Comment   Lipase 50  11 - 59 U/L   VITAMIN B12     Status: Normal   Collection Time   11/10/11  3:27 PM      Component Value Range Comment   Vitamin B-12 526  211 - 911 pg/mL   FOLATE     Status: Normal   Collection Time   11/10/11  3:27 PM      Component Value Range Comment   Folate 7.9     IRON AND TIBC     Status: Abnormal   Collection Time   11/10/11  3:27 PM      Component Value Range Comment   Iron 11 (*) 42 - 135 ug/dL    TIBC 811 (*) 914 - 782 ug/dL    Saturation Ratios 2 (*) 20 - 55 %    UIBC 447 (*) 125 - 400 ug/dL   FERRITIN     Status: Normal   Collection Time   11/10/11  3:27 PM      Component Value Range Comment   Ferritin 24  22 - 322 ng/mL   RETICULOCYTES     Status: Abnormal   Collection Time   11/10/11  3:27 PM      Component Value Range Comment   Retic Ct Pct 2.4  0.4 - 3.1 %    RBC. 3.94 (*) 4.22 - 5.81 MIL/uL    Retic Count, Manual 94.6  19.0 - 186.0 K/uL   SAMPLE TO BLOOD BANK     Status: Normal  Collection Time   11/10/11  3:30 PM      Component Value Range Comment   Blood Bank Specimen SAMPLE AVAILABLE FOR TESTING      Sample Expiration 11/13/2011     PREPARE RBC (CROSSMATCH)     Status: Normal   Collection Time   11/10/11  3:30 PM      Component Value Range Comment   Order Confirmation ORDER PROCESSED BY BLOOD BANK     TYPE AND SCREEN     Status: Normal (Preliminary result)   Collection Time   11/10/11  3:30 PM      Component Value Range Comment   ABO/RH(D) A POS      Antibody Screen NEG      Sample Expiration 11/13/2011      Unit Number [id number]      Blood Component Type RED CELLS,LR      Unit division 00      Status of Unit ISSUED      Transfusion Status OK TO TRANSFUSE      Crossmatch Result Compatible      Unit Number [id number]      Blood Component  Type RED CELLS,LR      Unit division 00      Status of Unit ISSUED      Transfusion Status OK TO TRANSFUSE      Crossmatch Result Compatible     ABO/RH     Status: Normal   Collection Time   11/10/11  3:30 PM      Component Value Range Comment   ABO/RH(D) A POS     URINALYSIS, ROUTINE W REFLEX MICROSCOPIC     Status: Normal   Collection Time   11/10/11  4:09 PM      Component Value Range Comment   Color, Urine YELLOW  YELLOW    APPearance CLEAR  CLEAR    Specific Gravity, Urine 1.010  1.005 - 1.030    pH 5.5  5.0 - 8.0    Glucose, UA NEGATIVE  NEGATIVE mg/dL    Hgb urine dipstick NEGATIVE  NEGATIVE    Bilirubin Urine NEGATIVE  NEGATIVE    Ketones, ur NEGATIVE  NEGATIVE mg/dL    Protein, ur NEGATIVE  NEGATIVE mg/dL    Urobilinogen, UA 0.2  0.0 - 1.0 mg/dL    Nitrite NEGATIVE  NEGATIVE    Leukocytes, UA NEGATIVE  NEGATIVE MICROSCOPIC NOT DONE ON URINES WITH NEGATIVE PROTEIN, BLOOD, LEUKOCYTES, NITRITE, OR GLUCOSE <1000 mg/dL.  BASIC METABOLIC PANEL     Status: Abnormal   Collection Time   11/11/11  4:49 AM      Component Value Range Comment   Sodium 136  135 - 145 mEq/L    Potassium 4.0  3.5 - 5.1 mEq/L    Chloride 99  96 - 112 mEq/L    CO2 24  19 - 32 mEq/L    Glucose, Bld 98  70 - 99 mg/dL    BUN 17  6 - 23 mg/dL    Creatinine, Ser 7.82 (*) 0.50 - 1.35 mg/dL    Calcium 9.4  8.4 - 95.6 mg/dL    GFR calc non Af Amer 45 (*) >90 mL/min    GFR calc Af Amer 52 (*) >90 mL/min   CBC     Status: Abnormal   Collection Time   11/11/11  4:52 AM      Component Value Range Comment   WBC 10.5  4.0 - 10.5 K/uL    RBC 4.47  4.22 - 5.81 MIL/uL    Hemoglobin 8.3 (*) 13.0 - 17.0 g/dL    HCT 16.1 (*) 09.6 - 52.0 %    MCV 62.2 (*) 78.0 - 100.0 fL    MCH 18.6 (*) 26.0 - 34.0 pg    MCHC 29.9 (*) 30.0 - 36.0 g/dL    RDW 04.5 (*) 40.9 - 15.5 %    Platelets 69 (*) 150 - 400 K/uL     Dg Chest Portable 1 View  11/10/2011  *RADIOLOGY REPORT*  Clinical Data: Shortness of breath.  Pedal edema.   Heart murmur.  PORTABLE CHEST - 1 VIEW 11/10/2011 1543 hours:  Comparison: Two-view chest x-ray 05/23/2006.  Portable chest x-rays 05/22/2006 dating back to 05/17/1006.  Findings: Prior sternotomy CABG.  Cardiac silhouette markedly enlarged, with interval increase in the heart size, even allowing for differences in technique.  Diffuse interstitial pulmonary edema superimposed upon emphysematous changes in the upper lobes, left greater than right.  Bilateral pleural effusions, right greater than left, with associated consolidation in the right lower lobe.  IMPRESSION: CHF, with marked cardiomegaly and moderate diffuse interstitial pulmonary edema, superimposed upon COPD/emphysema.  Bilateral pleural effusions, right greater than left, with associated passive atelectasis in the right lower lobe.  Original Report Authenticated By: Arnell Sieving, M.D.    ROS Blood pressure 139/71, pulse 94, temperature 98.2 F (36.8 C), temperature source Oral, resp. rate 20, height 5\' 8"  (1.727 m), weight 143 lb 3.2 oz (64.955 kg), SpO2 97.00%. Physical ExamAlert and oriented. Skin warm and dry. Oral mucosa is moist.   . Sclera anicteric, conjunctivae is pink. Thyroid not enlarged. No cervical lymphadenopathy. Lungs clear. Heart regular rate and rhythm.  Abdomen is soft. Bowel sounds are positive. No hepatomegaly. No abdominal masses felt. Tenderness left lower quadrant on palpitation.  No edema to lower extremities.    Assessment/Plan: Anemia requiring transfusion of PRBCs x 2.  Colonic neoplasm needs to be ruled out given hx of tubular adenoma with high grade dysplasia.  l discuss this case with Dr. Karilyn Cota. Patient will need a colonoscopy. If normal colonoscopy, then EGD.  Cardiology to clear first and Dr Dietrich Pates is aware.   SETZER,TERRI W 11/11/2011, 8:25 AM     GI attending note; Patient interviewed and examined. Patient is 66 year old African male with history of coronary artery disease was admitted with  CHF and found to be profoundly in a manic with hemoglobin of 6.3 g. He denies nausea vomiting melena or rectal bleeding. He did experience single episode of hematuria but urine analysis on admission negative. Iron studies suggest iron deficiency anemia. His examination is pertinent for mild hepatomegaly and liver is slightly tender. Patient has history of colonic adenomas last exam was 7 years ago. He is long overdue for surveillance colonoscopy. Patient denies NSAID use but admits to drinking alcohol over the weekends. Recommendations; He will need a GI workup starting with colonoscopy to be followed by EGD if colonoscopy is negative. Given that he is not actively and ongoing cardiac issue will delay GI evaluation until deemed to be stable. Will continue to monitor H&H closely and for evidence of active bleeding.

## 2011-11-11 NOTE — Consult Note (Signed)
Patient Name: [name]  MRN: [id number]  HPI: [name] is an 66 y.o. malereferred for consultation by Manuel Colla, MD for congestive heart failure.  [name] was previously a patient of mine, but has been lost to followup since 2009. He was found to have three-vessel coronary artery disease in 2008 and underwent uncomplicated CABG surgery.  He had evidence for a previous inferolateral myocardial infarction, but overall LV systolic function was preserved.  He subsequently was effectively asymptomatic from a cardiopulmonary standpoint until a few weeks ago when he noted increased fatigue with modest exertion and dyspnea. He subsequently developed pedal edema and worsening symptoms prompting evaluation in the emergency department where he was found to have pulmonary edema and severe anemia. He has improved with diuresis and transfusion.  Past Medical History  Diagnosis Date  . Arteriosclerotic cardiovascular disease (ASCVD) 2005    Presented with cardiac arrest and was found to have inferobasilar scarring without critical coronary disease; catheterization in 2008 revealed three-vessel disease prompting CABG surgery  . Hypertension   . Hyperlipidemia   . GERD (gastroesophageal reflux disease)    Past Surgical History  Procedure Date  . Coronary artery bypass graft 2008  . Cholecystectomy   . Soft tissue cyst excision     Forehead   No family history on file.  Social History:  reports that he has been smoking.  He does not have any smokeless tobacco history on file. He reports that he drinks alcohol. His drug history not on file.  Allergies: No Known Allergies  Medications:  I have reviewed the patient's current medications. Prior to Admission:  Prescriptions prior to admission  Medication Sig Dispense Refill  . aspirin EC 81 MG tablet Take 81 mg by mouth every morning.      . furosemide (LASIX) 40 MG tablet Take 40 mg by mouth daily.      . potassium chloride SA  (K-DUR,KLOR-CON) 20 MEQ tablet Take 20 mEq by mouth daily.       Results for orders placed during the hospital encounter of 11/10/11 (from the past 48 hour(s))  CBC WITH DIFFERENTIAL     Status: Abnormal   Collection Time   11/10/11  3:27 PM      Component Value Range Comment   WBC 9.2  4.0 - 10.5 K/uL    RBC 3.95 (*) 4.22 - 5.81 MIL/uL    Hemoglobin 6.3 (*) 13.0 - 17.0 g/dL    HCT 82.9 (*) 56.2 - 52.0 %    MCV 59.2 (*) 78.0 - 100.0 fL    MCH 15.9 (*) 26.0 - 34.0 pg    MCHC 26.9 (*) 30.0 - 36.0 g/dL    RDW 13.0 (*) 86.5 - 15.5 %    Platelets 54 (*) 150 - 400 K/uL    Neutrophils Relative 67  43 - 77 %    Lymphocytes Relative 20  12 - 46 %    Monocytes Relative 11  3 - 12 %    Eosinophils Relative 1  0 - 5 %    Basophils Relative 1  0 - 1 %    Neutro Abs 6.2  1.7 - 7.7 K/uL    Lymphs Abs 1.8  0.7 - 4.0 K/uL    Monocytes Absolute 1.0  0.1 - 1.0 K/uL    Eosinophils Absolute 0.1  0.0 - 0.7 K/uL    Basophils Absolute 0.1  0.0 - 0.1 K/uL    RBC Morphology POLYCHROMASIA PRESENT  Smear Review LARGE PLATELETS PRESENT   GIANT PLATELETS SEEN  COMPREHENSIVE METABOLIC PANEL     Status: Abnormal   Collection Time   11/10/11  3:27 PM      Component Value Range Comment   Sodium 130 (*) 135 - 145 mEq/L    Potassium 3.7  3.5 - 5.1 mEq/L    Chloride 98  96 - 112 mEq/L    CO2 21  19 - 32 mEq/L    Glucose, Bld 98  70 - 99 mg/dL    BUN 14  6 - 23 mg/dL    Creatinine, Ser 1.61 (*) 0.50 - 1.35 mg/dL    Calcium 9.4  8.4 - 09.6 mg/dL    Total Protein 7.5  6.0 - 8.3 g/dL    Albumin 3.4 (*) 3.5 - 5.2 g/dL    AST 23  0 - 37 U/L    ALT 33  0 - 53 U/L    Alkaline Phosphatase 83  39 - 117 U/L    Total Bilirubin 0.9  0.3 - 1.2 mg/dL    GFR calc non Af Amer 45 (*) >90 mL/min    GFR calc Af Amer 52 (*) >90 mL/min   APTT     Status: Abnormal   Collection Time   11/10/11  3:27 PM      Component Value Range Comment   aPTT 39 (*) 24 - 37 seconds   PROTIME-INR     Status: Abnormal   Collection Time    11/10/11  3:27 PM      Component Value Range Comment   Prothrombin Time 15.4 (*) 11.6 - 15.2 seconds    INR 1.19  0.00 - 1.49   PRO B NATRIURETIC PEPTIDE     Status: Abnormal   Collection Time   11/10/11  3:27 PM      Component Value Range Comment   Pro B Natriuretic peptide (BNP) 2149.0 (*) 0 - 125 pg/mL   LIPASE, BLOOD     Status: Normal   Collection Time   11/10/11  3:27 PM      Component Value Range Comment   Lipase 50  11 - 59 U/L   VITAMIN B12     Status: Normal   Collection Time   11/10/11  3:27 PM      Component Value Range Comment   Vitamin B-12 526  211 - 911 pg/mL   FOLATE     Status: Normal   Collection Time   11/10/11  3:27 PM      Component Value Range Comment   Folate 7.9     IRON AND TIBC     Status: Abnormal   Collection Time   11/10/11  3:27 PM      Component Value Range Comment   Iron 11 (*) 42 - 135 ug/dL    TIBC 045 (*) 409 - 811 ug/dL    Saturation Ratios 2 (*) 20 - 55 %    UIBC 447 (*) 125 - 400 ug/dL   FERRITIN     Status: Normal   Collection Time   11/10/11  3:27 PM      Component Value Range Comment   Ferritin 24  22 - 322 ng/mL    Hemoglobin 8.3 (*) 13.0 - 17.0 g/dL    HCT 91.4 (*) 78.2 - 52.0 %    MCH 18.6 (*) 26.0 - 34.0 pg    MCHC 29.9 (*) 30.0 - 36.0 g/dL    RDW 95.6 (*) 21.3 -  15.5 %    Platelets 69 (*) 150 - 400 K/uL    Echocardiogram: Basilar inferolateral hypokinesis with mildly impaired overall LV systolic function; moderate mitral regurgitation. When compared to prior study performed in 2009, slight interval decrease in EF and substantial increase in the magnitude of mitral regurgitation.  Dg Chest Portable 1 View 11/10/11: Prior sternotomy, marked cardiomegaly, diffuse interstitial pulmonary edema superimposed on emphysematous changes; bilateral pleural effusions  Review of Systems: General: no anorexia, weight gain or weight loss Cardiac: no chest pain, dyspnea, orthopnea, PND,  or syncope Respiratory: no cough, sputum production or  hemoptysis GI: no nausea, abdominal pain, emesis, diarrhea or constipation Integument: no significant lesions Neurologic: No muscle weakness or paralysis; no speech disturbance; no headache  Physical Exam: Blood pressure 111/65, pulse 83, temperature 97.7 F (36.5 C), temperature source Oral, resp. rate 18, height 5\' 8"  (1.727 m), weight 64.955 kg (143 lb 3.2 oz), SpO2 97.00%. I&O: -5.7 L since admission, but balance today; Weight: Decreased by 20 pounds since admission General-Well-developed; no acute distress; slight build HEENT-[place]/AT; PERRL; EOM intact; conjunctiva and lids nl; bilateral arcus Neck-moderate JVD; no carotid bruits Endocrine-No thyromegaly Lungs-Clear lung fields; resonant percussion; normal I-to-E ratio Cardiovascular- normal PMI; normal S1 and S2; no third heart sound; Abdomen-BS normal; soft and non-tender without masses or organomegaly Musculoskeletal-No deformities, cyanosis or clubbing Neurologic-Nl cranial nerves; symmetric strength and tone Skin- Warm, no significant lesions Extremities-Nl distal pulses; trace edema  Assessment/Plan:  Patient Active Problem List   Diagnosis Date Noted  . Anemia, iron deficiency 11/11/2011  . Acute diastolic congestive heart failure 11/11/2011  . Arteriosclerotic cardiovascular disease (ASCVD)   . Hypertension   . Hyperlipidemia   . GERD (gastroesophageal reflux disease)    Congestive heart failure with preserved left ventricular systolic function: There is likely a component of high output CHF related to severe anemia; however, substantial mitral regurgitation is present as well. It is unclear whether this is secondary to CHF or a possible contributor to CHF. In any case, he is improved after a substantial diuresis. With continued neck vein elevation, additional fluid can be removed as tolerated.  Myocardial infarction will be ruled out.  IV fluid will be discontinued.  Chronic kidney disease: Present for at least the past  5 years without significant progression; tolerated the initial diuresis without any exacerbation of kidney disease. We will continue to monitor.  Iron deficiency anemia: Etiology unknown. Endoscopic studies we'll certainly be necessary, but I would defer these until Friday and optimize physical condition in the interim.  ASCVD-no indication for progression of disease.   [place] Bing, MD 11/11/2011, 6:28 PM

## 2011-11-11 NOTE — H&P (Signed)
[name] MRN: [id number] DOB/AGE: January 12, 1946 66 y.o. Primary Care Physician:No primary provider on file. Admit date: 11/10/2011 Chief Complaint: Bilateral leg swelling and SOB HPI: This a 66 years old male patient with history of multiple medical illness came to my office with above complaint. He missed his regular follow up and was out of his medication. He has not been to his cardiologist as well. He also gave history of intermittent rectal bleeding. Patient had sign of CHf. He was started on oral diuretics and labs were ordered. His lab showed very low H/H and elevated BNP. Patient was sent he was admitted through ER. Patient complains of generalized weakness. No headache, fever, chills, cough, chest, pain, nausea or vomiting.His rectal bleed has stopped. Occult blood test was negative in ER.   Past Medical History  Diagnosis Date  . Coronary artery disease   . Hypertension   . Hypercholesterolemia    Past Surgical History  Procedure Date  . Coronary artery bypass graft         No family history on file.  Social History:  reports that he has been smoking.  He does not have any smokeless tobacco history on file. He reports that he drinks alcohol. His drug history not on file.   Allergies: No Known Allergies  Medications Prior to Admission  Medication Sig Dispense Refill  . aspirin EC 81 MG tablet Take 81 mg by mouth every morning.      . furosemide (LASIX) 40 MG tablet Take 40 mg by mouth daily.      . potassium chloride SA (K-DUR,KLOR-CON) 20 MEQ tablet Take 20 mEq by mouth daily.           NWG:NFAOZ from the symptoms mentioned above,there are no other symptoms referable to all systems reviewed.  Physical Exam: Blood pressure 129/78, pulse 97, temperature 98.5 F (36.9 C), temperature source Oral, resp. rate 18, height 5\' 8"  (1.727 m), weight 64.955 kg (143 lb 3.2 oz), SpO2 96.00%. HE ENT- conjunctiva pale, pupils equal and reactive                 Neck  supple Respiratory- decreased air entry at base, bilateral rhonchi CVS- S1 and S2 heard, there is systoic murmur at apex ABD- soft and lax, bowel sound + EXT- 3+ bilateral edema    Basename 11/11/11 0452 11/10/11 1527  WBC 10.5 9.2  NEUTROABS -- 6.2  HGB 8.3* 6.3*  HCT 27.8* 23.4*  MCV 62.2* 59.2*  PLT 69* 54*    Basename 11/11/11 0449 11/10/11 1527  NA 136 130*  K 4.0 3.7  CL 99 98  CO2 24 21  GLUCOSE 98 98  BUN 17 14  CREATININE 1.55* 1.54*  CALCIUM 9.4 9.4  MG -- --  lablast2(ast:2,ALT:2,alkphos:2,bilitot:2,prot:2,albumin:2)@    No results found for this or any previous visit (from the past 240 hour(s)).   Dg Chest Portable 1 View  11/10/2011  *RADIOLOGY REPORT*  Clinical Data: Shortness of breath.  Pedal edema.  Heart murmur.  PORTABLE CHEST - 1 VIEW 11/10/2011 1543 hours:  Comparison: Two-view chest x-ray 05/23/2006.  Portable chest x-rays 05/22/2006 dating back to 05/17/1006.  Findings: Prior sternotomy CABG.  Cardiac silhouette markedly enlarged, with interval increase in the heart size, even allowing for differences in technique.  Diffuse interstitial pulmonary edema superimposed upon emphysematous changes in the upper lobes, left greater than right.  Bilateral pleural effusions, right greater than left, with associated consolidation in the right lower lobe.  IMPRESSION: CHF, with  marked cardiomegaly and moderate diffuse interstitial pulmonary edema, superimposed upon COPD/emphysema.  Bilateral pleural effusions, right greater than left, with associated passive atelectasis in the right lower lobe.  Original Report Authenticated By: Arnell Sieving, M.D.   Impression: 1. Severe anemia probably secondary to chronic blood loos 2. Acute systolic CHF 3. H/O CAD and S/p MI 4. S/P CABG 5. Medical non complains 6. Tobacco smoking 7.Hyperlipedemia 8. H/O GERD Active Problems:  * No active hospital problems. *      Plan: 1. Type and cross match and transfuse 2  units 3. IV diuretics 3. Echocardiogram 4. Cardiology and GI consult      Niraj Kudrna   11/11/2011, 7:46 AM

## 2011-11-11 NOTE — Progress Notes (Signed)
UR chart review completed.  

## 2011-11-12 LAB — BASIC METABOLIC PANEL
BUN: 17 mg/dL (ref 6–23)
CO2: 27 mEq/L (ref 19–32)
Calcium: 10.2 mg/dL (ref 8.4–10.5)
Creatinine, Ser: 1.8 mg/dL — ABNORMAL HIGH (ref 0.50–1.35)

## 2011-11-12 LAB — CBC
MCH: 18 pg — ABNORMAL LOW (ref 26.0–34.0)
MCV: 62.6 fL — ABNORMAL LOW (ref 78.0–100.0)
Platelets: 111 10*3/uL — ABNORMAL LOW (ref 150–400)
RDW: 25.3 % — ABNORMAL HIGH (ref 11.5–15.5)

## 2011-11-12 MED ORDER — PEG 3350-KCL-NABCB-NACL-NASULF 236 G PO SOLR
4000.0000 mL | Freq: Once | ORAL | Status: AC
  Administered 2011-11-12: 4000 mL via ORAL
  Filled 2011-11-12: qty 4000

## 2011-11-12 MED ORDER — FUROSEMIDE 40 MG PO TABS
40.0000 mg | ORAL_TABLET | Freq: Two times a day (BID) | ORAL | Status: DC
  Administered 2011-11-12: 40 mg via ORAL
  Filled 2011-11-12: qty 1

## 2011-11-12 MED ORDER — SODIUM CHLORIDE 0.9 % IV SOLN
INTRAVENOUS | Status: DC
  Administered 2011-11-12: 20 mL/h via INTRAVENOUS
  Administered 2011-11-12 – 2011-11-14 (×3): via INTRAVENOUS

## 2011-11-12 MED ORDER — SODIUM CHLORIDE 0.9 % IJ SOLN
INTRAMUSCULAR | Status: AC
  Administered 2011-11-12: 10 mL
  Filled 2011-11-12: qty 3

## 2011-11-12 NOTE — Progress Notes (Addendum)
HPI: Feeling and breathing better. Mild chest pain on deep inspiration.  Has not gotten up in the room walk and walked, and therefore is uncertain if he is dyspneic on exertion.  No Known Allergies  Current Facility-Administered Medications  Medication Dose Route Frequency Provider Last Rate Last Dose  . acetaminophen (TYLENOL) tablet 500 mg  500 mg Oral Q6H PRN Avon Gully, MD   500 mg at 11/11/11 1352  . aspirin chewable tablet 81 mg  81 mg Oral Daily Avon Gully, MD   81 mg at 11/12/11 0858  . carvedilol (COREG) tablet 6.25 mg  6.25 mg Oral BID WC Avon Gully, MD   6.25 mg at 11/12/11 0858  . ferrous sulfate tablet 325 mg  325 mg Oral BID WC Avon Gully, MD   325 mg at 11/12/11 0859  . furosemide (LASIX) injection 40 mg  40 mg Intravenous TID Kathlen Brunswick, MD   40 mg at 11/12/11 0858  . pantoprazole (PROTONIX) injection 40 mg  40 mg Intravenous Q24H Avon Gully, MD   40 mg at 11/12/11 0858  . pneumococcal 23 valent vaccine (PNU-IMMUNE) injection 0.5 mL  0.5 mL Intramuscular Tomorrow-1000 Avon Gully, MD      . potassium chloride SA (K-DUR,KLOR-CON) CR tablet 40 mEq  40 mEq Oral BID Avon Gully, MD   40 mEq at 11/12/11 0858  . DISCONTD: 0.9 %  sodium chloride infusion   Intravenous Continuous Ward Givens, MD 100 mL/hr at 11/11/11 1400    . DISCONTD: furosemide (LASIX) injection 20 mg  20 mg Intravenous BID Kathlen Brunswick, MD      . DISCONTD: furosemide (LASIX) injection 40 mg  40 mg Intravenous BID Avon Gully, MD   40 mg at 11/11/11 1727    Past Medical History  Diagnosis Date  . Arteriosclerotic cardiovascular disease (ASCVD) 2005    Presented with cardiac arrest and was found to have inferobasilar scarring without critical coronary disease; catheterization in 2008 revealed three-vessel disease prompting CABG surgery  . Hypertension   . Hyperlipidemia   . GERD (gastroesophageal reflux disease)     Past Surgical History  Procedure Date  . Coronary artery  bypass graft 2008  . Cholecystectomy   . Soft tissue cyst excision     Forehead  WT Loss 20 lbs since admission.  5 lbs overnight. I&O: -10.5 L since admission  Metabolic profile: Sodium of 134, potassium-4, creatinine increased from 1.55 to 1.80.  XBJ:YNWGNF of systems complete and found to be negative unless listed above PHYSICAL EXAM BP 114/70  Pulse 87  Temp 98.4 F (36.9 C) (Oral)  Resp 17  Ht 5\' 8"  (1.727 m)  Wt 143 lb 3.2 oz (64.955 kg)  BMI 21.77 kg/m2  SpO2 93%General: Well developed, well nourished, in no acute distress Head: Eyes PERRLA, No xanthomas.   Normal cephalic and atramatic  Lungs: Clear bilaterally to auscultation and percussion. Heart: HRRR S1 S2, 21/6 systolic murmur.  Pulses are 2+ & equal.            No carotid bruit. No JVD.  No abdominal bruits. No femoral bruits. Abdomen: Bowel sounds are positive, abdomen soft and non-tender without masses or                  Hernia's noted. Msk:  Back normal, normal gait. Normal strength and tone for age. Extremities: No clubbing, cyanosis or edema.  DP +1 Neuro: Alert and oriented X 3. Psych:  Good affect, responds appropriately  Telemetry: NSR HR in the 80's.  ASSESSMENT AND PLAN  1. CHF: He is diuresed approximately 20 pounds since admission. He is breathing and feeling better. He does have some inspiratory chest discomfort only. Echo reveals LVEF of 45-50% with moderate to severe hypokinesis and thinning of the basal mid inferior lateral myocardium. Echo in 2008 revealed an EF of 50-55% with akinesis of the inferior posterior wall. He is continued on Lasix 40 mg IV 3 times a day. With potassium replacement. Creatinine has risen to 1.80 from 1.55 overnight. We will back on IV diuretics as there is no clinical evidence of fluid retention at this time. Lungs are essentially clear with some diminished breath sounds in the right base. Will repeat chest x-ray for evaluation of clearing. Change Lasix to by mouth, 40 mg  twice a day. Continue Coreg. Consider repeat stress Myoview to evaluate for ischemic changes although this does not need to be done with any urgency.  2. CAD s/p CABG: Cardiac enzymes have been negative, no acute EKG changes indicative of ACS. He does have some nonspecific T-wave abnormalities in the lateral leads noted.  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 I do not see where he stands or intervention was completed at that time.   3. Anemia: Hemoglobin is improved this a.m. from 8.3-9.1. He is being followed by Dr. Karilyn Cota, GI, with plans for colonoscopy once he is stable from a cardiac standpoint. He can proceed with this now, as he appears euvolemic, pain-free, and breathing status has improved.  Cardiology Attending Patient interviewed and examined. Discussed with Joni Reining, NP.  Above note annotated and modified based upon my findings.  Although he has minor chest discomfort, troponin was negative, and myocardial ischemia or infarction appears unlikely. There is been some deterioration in renal function. I would hold diuretics until creatinine returns to baseline. He may not need that much diuretic in the long-term since the conditions contributing to high output CHF are no longer present.  There is no evidence for an acute event. CHF likely caused by a combination of mild systolic dysfunction, diastolic dysfunction, chronic kidney disease and severe anemia.  Chest x-ray pending to verify resolution of CHF radiographically. Patient appears fine to undergo upper and/or lower endoscopy.  Kingsbury Bing, MD 11/12/2011, 6:11 PM

## 2011-11-12 NOTE — Progress Notes (Signed)
Patient has no complaints. He states his breathing is back to normal. He denies chest or abdominal pain. Cardiology note from yesterday and today appreciated. Patient cleared for endoscopic evaluation. Hemoccult result i pending. H&H 9.1 and 31.6. Assessment; Dyspnea has resolved; he appears to be back in compensated stage of CHF and deemed to be stable for endoscopic evaluation. Iron deficiency anemia. History of colonic adenomas; last colonoscopy was in November 2006. Recommendations; Will proceed with EGD and colonoscopy in a.m. Patient is agreeable.These studies will be performed by Dr. Darrick Penna in a.m. He would be prep with [facility] this afternoon.

## 2011-11-12 NOTE — Progress Notes (Signed)
Subjective: Patient is resting. He feels better. No new complaint.  Objective: Vital signs in last 24 hours: Temp:  [97.7 F (36.5 C)-98.4 F (36.9 C)] 98.4 F (36.9 C) (08/16 0521) Pulse Rate:  [83-94] 87  (08/16 0521) Resp:  [17-20] 17  (08/16 0521) BP: (111-139)/(65-74) 114/70 mmHg (08/16 0521) SpO2:  [93 %-99 %] 93 % (08/16 0521) Weight change:  Last BM Date: 11/10/11  Intake/Output from previous day: 08/15 0701 - 08/16 0700 In: 2174 [P.O.:1460; I.V.:700; IV Piggyback:14] Out: 6700 [Urine:6700]  PHYSICAL EXAM General appearance: alert and no distress Resp: clear to auscultation bilaterally Cardio: S1, S2 normal and systolic murmur: systolic ejection 4/6, blowing at apex GI: soft, non-tender; bowel sounds normal; no masses,  no organomegaly Extremities: 3+  Lab Results:    @labtest @ [facility] No results found for this basename: PHART,PCO2,PO2ART,TCO2,HCO3 in the last 72 hours CULTURES No results found for this or any previous visit (from the past 240 hour(s)). Studies/Results: Dg Chest Portable 1 View  11/10/2011  *RADIOLOGY REPORT*  Clinical Data: Shortness of breath.  Pedal edema.  Heart murmur.  PORTABLE CHEST - 1 VIEW 11/10/2011 1543 hours:  Comparison: Two-view chest x-ray 05/23/2006.  Portable chest x-rays 05/22/2006 dating back to 05/17/1006.  Findings: Prior sternotomy CABG.  Cardiac silhouette markedly enlarged, with interval increase in the heart size, even allowing for differences in technique.  Diffuse interstitial pulmonary edema superimposed upon emphysematous changes in the upper lobes, left greater than right.  Bilateral pleural effusions, right greater than left, with associated consolidation in the right lower lobe.  IMPRESSION: CHF, with marked cardiomegaly and moderate diffuse interstitial pulmonary edema, superimposed upon COPD/emphysema.  Bilateral pleural effusions, right greater than left, with associated passive atelectasis in the right lower lobe.   Original Report Authenticated By: Arnell Sieving, M.D.    Medications: I have reviewed the patient's current medications.  Assesment: 1. Severe anemia probably secondary to chronic blood loos  2. Acute systolic CHF  3. H/O CAD and S/p MI  4. S/P CABG  5. Medical non complains  6. Tobacco smoking  7.Hyperlipedemia  8. H/O GERD  Active Problems:  Arteriosclerotic cardiovascular disease (ASCVD)  Hypertension  Hyperlipidemia  Anemia, iron deficiency  Acute diastolic congestive heart failure  Chronic kidney disease (CKD), stage III (moderate)    Plan: Continue Iv diuretics We will continue to monitor cbc/bmp Cardiology and GI consult appreciated  LOS: 2 days   Fortino Haag 11/12/2011, 7:29 AM

## 2011-11-13 ENCOUNTER — Inpatient Hospital Stay (HOSPITAL_COMMUNITY): Payer: Medicare Other

## 2011-11-13 LAB — CBC WITH DIFFERENTIAL/PLATELET
Basophils Relative: 1 % (ref 0–1)
Hemoglobin: 9.6 g/dL — ABNORMAL LOW (ref 13.0–17.0)
Lymphocytes Relative: 16 % (ref 12–46)
Lymphs Abs: 2.1 10*3/uL (ref 0.7–4.0)
MCHC: 29.1 g/dL — ABNORMAL LOW (ref 30.0–36.0)
Monocytes Relative: 9 % (ref 3–12)
Neutro Abs: 9.9 10*3/uL — ABNORMAL HIGH (ref 1.7–7.7)
Neutrophils Relative %: 73 % (ref 43–77)
RBC: 5.2 MIL/uL (ref 4.22–5.81)
WBC: 13.5 10*3/uL — ABNORMAL HIGH (ref 4.0–10.5)

## 2011-11-13 LAB — BASIC METABOLIC PANEL
CO2: 27 mEq/L (ref 19–32)
Calcium: 10 mg/dL (ref 8.4–10.5)
Potassium: 4.2 mEq/L (ref 3.5–5.1)
Sodium: 128 mEq/L — ABNORMAL LOW (ref 135–145)

## 2011-11-13 MED ORDER — SODIUM CHLORIDE 0.9 % IJ SOLN
INTRAMUSCULAR | Status: AC
  Administered 2011-11-13: 10 mL
  Filled 2011-11-13: qty 3

## 2011-11-13 MED ORDER — BISACODYL 5 MG PO TBEC
10.0000 mg | DELAYED_RELEASE_TABLET | Freq: Once | ORAL | Status: AC
  Administered 2011-11-13: 10 mg via ORAL
  Filled 2011-11-13: qty 2

## 2011-11-13 MED ORDER — BISACODYL 5 MG PO TBEC
10.0000 mg | DELAYED_RELEASE_TABLET | Freq: Once | ORAL | Status: AC
  Administered 2011-11-13: 10 mg via ORAL
  Filled 2011-11-13 (×2): qty 1

## 2011-11-13 NOTE — Progress Notes (Signed)
Subjective: PT ADMITTED WITH HB 6.2 AND NOW IT'S 9.6 AND STABLE. PT LAST REGULAR MEAL 8/16 AT 5-6PM. STARTED CLEARS AROUND 6 PM. PT TOOK ENTIRE 4Ls W/O NAUSEA OR VOMITING. NO QUESTION OR CONCERNS.  SBP BORDERLINE. PLT COUNT UP FROM 54k TO 143k. WBC & CREATININE TRENDING UP. NA TRENDING DOWN. LAST TCS 2006 NO PROBLEMS WITH SEDATION D50 V5- 1.5 CM RS POLYP WITH HGD/CIS, NO INVASIVE TUMOR.  Objective: Vital signs in last 24 hours: Temp:  [97.4 F (36.3 C)-98.2 F (36.8 C)] 98.2 F (36.8 C) (08/17 0526) Pulse Rate:  [78-94] 87  (08/17 0827) Resp:  [18-20] 18  (08/17 0526) BP: (99-126)/(70-81) 122/70 mmHg (08/17 0526) SpO2:  [97 %-100 %] 98 % (08/17 0827) Weight:  [144 lb 8 oz (65.545 kg)] 144 lb 8 oz (65.545 kg) (08/17 0446) Last BM Date: 11/13/11  Intake/Output from previous day: 08/16 0701 - 08/17 0700 In: 2332.7 [P.O.:1960; I.V.:362.7; IV Piggyback:10] Out: 1450 [Urine:1450] Intake/Output this shift:    General appearance: alert, cooperative and no distress Resp: clear to auscultation bilaterally Cardio: regular rate and rhythm GI: soft, non-tender; bowel sounds normal;   Lab Results:  Basename 11/13/11 0600 11/12/11 0439 11/11/11 0452  WBC 13.5* 13.6* 10.5  HGB 9.6* 9.1* 8.3*  HCT 33.0* 31.6* 27.8*  PLT 143* 111* 69*   BMET  Basename 11/13/11 0602 11/12/11 0439 11/11/11 0449  NA 128* 134* 136  K 4.2 4.0 4.0  CL 91* 97 99  CO2 27 27 24   GLUCOSE 91 93 98  BUN 18 17 17   CREATININE 1.90* 1.80* 1.55*  CALCIUM 10.0 10.2 9.4   LFT  Basename 11/10/11 1527  PROT 7.5  ALBUMIN 3.4*  AST 23  ALT 33  ALKPHOS 83  BILITOT 0.9  BILIDIR --  IBILI --   PT/INR  Basename 11/10/11 1527  LABPROT 15.4*  INR 1.19   Hepatitis Panel No results found for this basename: HEPBSAG,HCVAB,HEPAIGM,HEPBIGM in the last 72 hours C-Diff No results found for this basename: CDIFFTOX:3 in the last 72 hours Fecal Lactopherrin No results found for this basename: FECLLACTOFRN in the last  72 hours  Studies/Results: ECHO 8/15: ELEVATED PULMONARY PRESSURE  Medications: I have reviewed the patient's current medications.  Assessment/Plan: ADMITTED WITH PROFOUND MICROCYTIC ANEMIA AND PMHx; ADVANCED POLYP REMOVED FROM RECTOSIGMOID REGION AND FAILED TO FOLLOW UP FOR SURVEILLANCE. TODAY BORDERLINE BP AND ELEVATED CREATININE. PLT RECOVERING.  PLAN: 1. HOLD COREG 8/18 AM. 2. NS @ 50 CC/HR 3. CHECK BMP/CBC ON 8/18 4. TAP WATER ENEMA/DULCOLAX TODAY 5. CLEAR LIQUIDS THEN NPO AFTER MN EXCEPT MEDS. 6. TCS/EGD ON 8/18.   LOS: 3 days   [facility] 11/13/2011, 9:54 AM

## 2011-11-13 NOTE — Progress Notes (Signed)
Subjective: This is a 66 year old patient of Dr. Felecia Lyons who was admitted with anemia. He also has known coronary artery occlusive disease. He is undergoing EGD and colonoscopy today. He also has a history of congestive heart failure.  Objective: Vital signs in last 24 hours: Temp:  [97.4 F (36.3 C)-98.2 F (36.8 C)] 98.2 F (36.8 C) (08/17 0526) Pulse Rate:  [78-94] 87  (08/17 0827) Resp:  [18-20] 18  (08/17 0526) BP: (99-126)/(70-81) 122/70 mmHg (08/17 0526) SpO2:  [97 %-100 %] 98 % (08/17 0827) Weight:  [65.545 kg (144 lb 8 oz)] 65.545 kg (144 lb 8 oz) (08/17 0446) Weight change:  Last BM Date: 11/13/11  Intake/Output from previous day: 08/16 0701 - 08/17 0700 In: 2332.7 [P.O.:1960; I.V.:362.7; IV Piggyback:10] Out: 1450 [Urine:1450]  PHYSICAL EXAM General appearance: alert, cooperative and no distress Resp: clear to auscultation bilaterally Cardio: regular rate and rhythm, S1, S2 normal, no murmur, click, rub or gallop GI: soft, non-tender; bowel sounds normal; no masses,  no organomegaly Extremities: extremities normal, atraumatic, no cyanosis or edema  Lab Results:    Basic Metabolic Panel:  Basename 11/13/11 0602 11/12/11 0439  NA 128* 134*  K 4.2 4.0  CL 91* 97  CO2 27 27  GLUCOSE 91 93  BUN 18 17  CREATININE 1.90* 1.80*  CALCIUM 10.0 10.2  MG -- --  PHOS -- --   Liver Function Tests:  [name] 11/10/11 1527  AST 23  ALT 33  ALKPHOS 83  BILITOT 0.9  PROT 7.5  ALBUMIN 3.4*    Basename 11/10/11 1527  LIPASE 50  AMYLASE --   No results found for this basename: AMMONIA:2 in the last 72 hours CBC:  Basename 11/13/11 0600 11/12/11 0439 11/10/11 1527  WBC 13.5* 13.6* --  NEUTROABS 9.9* -- 6.2  HGB 9.6* 9.1* --  HCT 33.0* 31.6* --  MCV 63.5* 62.6* --  PLT 143* 111* --   Cardiac Enzymes:  Basename 11/11/11 1932  CKTOTAL --  CKMB --  CKMBINDEX --  TROPONINI <0.30   BNP:  Basename 11/10/11 1527  PROBNP 2149.0*   D-Dimer: No results  found for this basename: DDIMER:2 in the last 72 hours CBG: No results found for this basename: GLUCAP:6 in the last 72 hours Hemoglobin A1C: No results found for this basename: HGBA1C in the last 72 hours Fasting Lipid Panel:  Basename 11/11/11 1932  CHOL 99  HDL 30*  LDLCALC 58  TRIG 56  CHOLHDL 3.3  LDLDIRECT --   Thyroid Function Tests: No results found for this basename: TSH,T4TOTAL,FREET4,T3FREE,THYROIDAB in the last 72 hours Anemia Panel:  Basename 11/10/11 1527  VITAMINB12 526  FOLATE 7.9  FERRITIN 24  TIBC 458*  IRON 11*  RETICCTPCT 2.4   Coagulation:  Basename 11/10/11 1527  LABPROT 15.4*  INR 1.19   Urine Drug Screen: Drugs of Abuse  No results found for this basename: labopia, cocainscrnur, labbenz, amphetmu, thcu, labbarb    Alcohol Level: No results found for this basename: ETH:2 in the last 72 hours Urinalysis:  Basename 11/10/11 1609  COLORURINE YELLOW  LABSPEC 1.010  PHURINE 5.5  GLUCOSEU NEGATIVE  HGBUR NEGATIVE  BILIRUBINUR NEGATIVE  KETONESUR NEGATIVE  PROTEINUR NEGATIVE  UROBILINOGEN 0.2  NITRITE NEGATIVE  LEUKOCYTESUR NEGATIVE   Misc. Labs:  [facility] No results found for this basename: PHART,PCO2,PO2ART,TCO2,HCO3 in the last 72 hours CULTURES No results found for this or any previous visit (from the past 240 hour(s)). Studies/Results: No results found.  Medications:  Scheduled:   .  aspirin  81 mg Oral Daily  . carvedilol  6.25 mg Oral BID WC  . pantoprazole (PROTONIX) IV  40 mg Intravenous Q24H  . pneumococcal 23 valent vaccine  0.5 mL Intramuscular Tomorrow-1000  . polyethylene glycol  4,000 mL Oral Once  . sodium chloride      . DISCONTD: ferrous sulfate  325 mg Oral BID WC  . DISCONTD: furosemide  40 mg Intravenous TID  . DISCONTD: furosemide  40 mg Oral BID  . DISCONTD: potassium chloride  40 mEq Oral BID   Continuous:   . sodium chloride 20 mL/hr (11/12/11 2021)   WUJ:[id number]  Assesment: He was admitted  with anemia which is iron deficiency. He has diastolic CHF which is improved. He has chronic kidney disease, hypertension and hyperlipidemia all of which seem stable. He is mildly hyponatremic today. Active Problems:  Arteriosclerotic cardiovascular disease (ASCVD)  Hypertension  Hyperlipidemia  Anemia, iron deficiency  Acute diastolic congestive heart failure  Chronic kidney disease (CKD), stage III (moderate)    Plan: For EGD and colonoscopy.    LOS: 3 days   [name] 11/13/2011, 9:44 AM

## 2011-11-13 NOTE — Progress Notes (Signed)
Patient given tap water enema - clear water from bowels presented.

## 2011-11-14 ENCOUNTER — Encounter (HOSPITAL_COMMUNITY)
Admission: EM | Disposition: A | Discharge status: Home / Self Care | Payer: Self-pay | Source: Home / Self Care | Attending: Internal Medicine

## 2011-11-14 DIAGNOSIS — K5730 Diverticulosis of large intestine without perforation or abscess without bleeding: Secondary | ICD-10-CM

## 2011-11-14 DIAGNOSIS — D649 Anemia, unspecified: Secondary | ICD-10-CM

## 2011-11-14 DIAGNOSIS — K2960 Other gastritis without bleeding: Secondary | ICD-10-CM

## 2011-11-14 DIAGNOSIS — D126 Benign neoplasm of colon, unspecified: Secondary | ICD-10-CM

## 2011-11-14 DIAGNOSIS — K222 Esophageal obstruction: Secondary | ICD-10-CM

## 2011-11-14 DIAGNOSIS — D509 Iron deficiency anemia, unspecified: Secondary | ICD-10-CM

## 2011-11-14 HISTORY — PX: ESOPHAGOGASTRODUODENOSCOPY: SHX5428

## 2011-11-14 HISTORY — PX: COLONOSCOPY: SHX5424

## 2011-11-14 LAB — BASIC METABOLIC PANEL
GFR calc Af Amer: 48 mL/min — ABNORMAL LOW (ref >90)
GFR calc non Af Amer: 41 mL/min — ABNORMAL LOW (ref >90)
Glucose, Bld: 96 mg/dL (ref 70–99)
Potassium: 4.1 mEq/L (ref 3.5–5.1)
Sodium: 132 mEq/L — ABNORMAL LOW (ref 135–145)

## 2011-11-14 LAB — CBC WITH DIFFERENTIAL/PLATELET
Basophils Relative: 1 % (ref 0–1)
Eosinophils Relative: 1 % (ref 0–5)
Hemoglobin: 9.5 g/dL — ABNORMAL LOW (ref 13.0–17.0)
Lymphocytes Relative: 16 % (ref 12–46)
MCH: 18.1 pg — ABNORMAL LOW (ref 26.0–34.0)
Neutrophils Relative %: 74 % (ref 43–77)
RBC: 5.26 MIL/uL (ref 4.22–5.81)

## 2011-11-14 SURGERY — EGD (ESOPHAGOGASTRODUODENOSCOPY)
Anesthesia: Moderate Sedation

## 2011-11-14 MED ORDER — MIDAZOLAM HCL 5 MG/5ML IJ SOLN
INTRAMUSCULAR | Status: AC
  Filled 2011-11-14: qty 10

## 2011-11-14 MED ORDER — SODIUM CHLORIDE 0.9 % IJ SOLN
INTRAMUSCULAR | Status: DC | PRN
  Administered 2011-11-14: 10:00:00

## 2011-11-14 MED ORDER — BUTAMBEN-TETRACAINE-BENZOCAINE 2-2-14 % EX AERO
INHALATION_SPRAY | CUTANEOUS | Status: DC | PRN
  Administered 2011-11-14: 1 via TOPICAL

## 2011-11-14 MED ORDER — STERILE WATER FOR IRRIGATION IR SOLN
Status: DC | PRN
  Administered 2011-11-14: 09:00:00

## 2011-11-14 MED ORDER — SODIUM CHLORIDE 0.9 % IJ SOLN
INTRAMUSCULAR | Status: AC
  Administered 2011-11-14: 10 mL
  Filled 2011-11-14: qty 3

## 2011-11-14 MED ORDER — MEPERIDINE HCL 100 MG/ML IJ SOLN
INTRAMUSCULAR | Status: DC | PRN
  Administered 2011-11-14 (×3): 25 mg via INTRAVENOUS

## 2011-11-14 MED ORDER — SPOT INK MARKER SYRINGE KIT
PACK | SUBMUCOSAL | Status: DC | PRN
  Administered 2011-11-14: 1 mL via SUBMUCOSAL

## 2011-11-14 MED ORDER — MEPERIDINE HCL 100 MG/ML IJ SOLN
INTRAMUSCULAR | Status: AC
  Filled 2011-11-14: qty 1

## 2011-11-14 MED ORDER — EPINEPHRINE HCL 0.1 MG/ML IJ SOLN
INTRAMUSCULAR | Status: AC
  Filled 2011-11-14: qty 10

## 2011-11-14 MED ORDER — MIDAZOLAM HCL 5 MG/5ML IJ SOLN
INTRAMUSCULAR | Status: DC | PRN
  Administered 2011-11-14 (×3): 1 mg via INTRAVENOUS
  Administered 2011-11-14: 2 mg via INTRAVENOUS

## 2011-11-14 MED ORDER — PANTOPRAZOLE SODIUM 40 MG PO TBEC
40.0000 mg | DELAYED_RELEASE_TABLET | Freq: Two times a day (BID) | ORAL | Status: DC
  Administered 2011-11-14 – 2011-11-15 (×3): 40 mg via ORAL
  Filled 2011-11-14 (×3): qty 1

## 2011-11-14 NOTE — Progress Notes (Signed)
Notified Dr. Ouida Sills that pt had 5 beat run of vtach. Orders to continue to monitor pt and notify Dr. Juanetta Gosling who would be seeing the pt when he rounded. Notified Dr. Juanetta Gosling at approximately 0800, ordered to notify Dr. Felecia Shelling, the pts primary physician when he arrived, because he was coming to see the pt today. I notified Dr. Felecia Shelling at [phone], no orders received. Sheryn Bison

## 2011-11-14 NOTE — Brief Op Note (Signed)
11/10/2011 - 11/14/2011  11:34 AM  PATIENT:  [name]  66 y.o. male  PRE-OPERATIVE DIAGNOSIS:  anemia; history of advanced colonic adenomas.  POST-OPERATIVE DIAGNOSIS:  normal TI, Cecal, Ascending colon, Descending colon and sigmoid colon polyps(13), erosive gastritis and duodenitis peptic esophageal stricture  PROCEDURE:  Procedure(s) (LRB): ESOPHAGOGASTRODUODENOSCOPY (EGD) (N/A) COLONOSCOPY (N/A) with spot tattoo/epinephrine injection  SURGEON:  Surgeon(s) and Role:    * West Bali, MD - Primary

## 2011-11-14 NOTE — Progress Notes (Signed)
Subjective: Patient feels better. His leg edema has subsided. He is scheduled for endoscopy today.  Objective: Vital signs in last 24 hours: Temp:  [98.1 F (36.7 C)-98.6 F (37 C)] 98.1 F (36.7 C) (08/18 0721) Pulse Rate:  [75-85] 80  (08/18 0803) Resp:  [18-20] 20  (08/18 0721) BP: (110-122)/(65-71) 113/71 mmHg (08/18 0721) SpO2:  [97 %-100 %] 97 % (08/18 0803) Weight:  [64.2 kg (141 lb 8.6 oz)] 64.2 kg (141 lb 8.6 oz) (08/18 0706) Weight change:  Last BM Date: 11/13/11  Intake/Output from previous day: 08/17 0701 - 08/18 0700 In: 1836.5 [P.O.:840; I.V.:986.5; IV Piggyback:10] Out: 350 [Urine:350]  PHYSICAL EXAM Chest- clear CVS- S1 and s2  Heard, systolic murmur at apex abd- sof and lax , bowel sound + Ext- no leg edema  Lab Results:    @labtest @ [facility] No results found for this basename: PHART,PCO2,PO2ART,TCO2,HCO3 in the last 72 hours CULTURES No results found for this or any previous visit (from the past 240 hour(s)). Studies/Results: Dg Chest 2 View  11/13/2011  *RADIOLOGY REPORT*  Clinical Data: Chest pain.  Low hemoglobin.  CHF.  Smoker.  CHEST - 2 VIEW  Comparison: 11/10/2011  Findings: Hyperinflation. Prior median sternotomy. Numerous leads and wires project over the chest.  Midline trachea.  Mild cardiomegaly with atherosclerosis in the transverse aorta. Improved to resolved bilateral pleural effusions.  There may be trace residual fluid on the lateral view. No pneumothorax. Improved to resolved interstitial edema with mild venous congestion persisting.  Improved bibasilar airspace disease.  Possible developing peripheral left upper lobe opacity.  IMPRESSION:  1.  Overall improved aeration with nearly completely resolved interstitial edema/venous congestion and improved to resolved small bilateral pleural effusions. 2.  Possible developing left upper lobe peripheral airspace opacity.  Consider short-term radiographic follow-up. 3.  Underlying hyperinflation/COPD. 4.   Improved bibasilar atelectasis.  Original Report Authenticated By: Consuello Bossier, M.D.    Medications: I have reviewed the patient's current medications.  Assesment: 1. Severe anemia probably secondary to chronic blood loos  2. Acute systolic CHF  3. H/O CAD and S/p MI  4. S/P CABG  5. Medical non complains  6. Tobacco smoking  7.Hyperlipedemia  8. H/O GERD  Active Problems:  Arteriosclerotic cardiovascular disease (ASCVD)  Hypertension  Hyperlipidemia  Anemia, iron deficiency  Acute diastolic congestive heart failure  Chronic kidney disease (CKD), stage III (moderate)    Plan We will change diuretics to po  panned for endoscopy today.  LOS: 4 days   Welton Bord 11/14/2011, 8:57 AM

## 2011-11-14 NOTE — H&P (Signed)
  Primary Care Physician:  No primary provider on file. Primary Gastroenterologist:  Dr. Darrick Penna  Pre-Procedure History & Physical: HPI:  [name] is a 66 y.o. male here for  ANEMIA.   Past Medical History  Diagnosis Date  . Arteriosclerotic cardiovascular disease (ASCVD) 2005    Presented with cardiac arrest and was found to have inferobasilar scarring without critical coronary disease; catheterization in 2008 revealed three-vessel disease prompting CABG surgery  . Hypertension   . Hyperlipidemia   . GERD (gastroesophageal reflux disease)     Past Surgical History  Procedure Date  . Coronary artery bypass graft 2008  . Cholecystectomy   . Soft tissue cyst excision     Forehead    Prior to Admission medications   Medication Sig Start Date End Date Taking? Authorizing Provider  aspirin EC 81 MG tablet Take 81 mg by mouth every morning.   Yes Historical Provider, MD  furosemide (LASIX) 40 MG tablet Take 40 mg by mouth daily.   Yes Historical Provider, MD  potassium chloride SA (K-DUR,KLOR-CON) 20 MEQ tablet Take 20 mEq by mouth daily.   Yes Historical Provider, MD    Allergies as of 11/10/2011  . (No Known Allergies)    No family history on file.  History   Social History  . Marital Status: Single    Spouse Name: N/A    Number of Children: N/A  . Years of Education: N/A   Occupational History  . Not on file.   Social History Main Topics  . Smoking status: Current Everyday Smoker  . Smokeless tobacco: Not on file  . Alcohol Use: Yes     Occ  . Drug Use:   . Sexually Active:    Other Topics Concern  . Not on file   Social History Narrative  . No narrative on file    Review of Systems: See HPI, otherwise negative ROS   Physical Exam: BP 113/71  Pulse 80  Temp 98.1 F (36.7 C) (Oral)  Resp 20  Ht 5\' 8"  (1.727 m)  Wt 141 lb 8.6 oz (64.2 kg)  BMI 21.52 kg/m2  SpO2 97% General:   Alert,  pleasant and cooperative in NAD Head:  Normocephalic and  atraumatic. Neck:  Supple;  Lungs:  Clear throughout to auscultation.    Heart:  Regular rate and rhythm. Abdomen:  Soft, nontender and nondistended. Normal bowel sounds, without guarding, and without rebound.   Neurologic:  Alert and  oriented x4;  grossly normal neurologically.  Impression/Plan:     Anemia/diarrhea  PLAN: TCS/?egd TODAY

## 2011-11-14 NOTE — Op Note (Signed)
[name], [name]                  ACCOUNT NO.:  [id number]  MEDICAL RECORD NO.:  [id number]  LOCATION:  [facility]                          FACILITY:  [facility]  PHYSICIAN:  Jonette Eva, M.D.     DATE OF BIRTH:  14-Sep-1945  DATE OF PROCEDURE:  11/14/2011 DATE OF DISCHARGE:                              OPERATIVE REPORT   REFERRING PHYSICIAN:  [name], MD  PROCEDURE DATE:  November 14, 2011.  PRIMARY GASTROENTEROLOGIST:  Lionel December, M.D.  PROCEDURE: 1. ILEOColonoscopy with cold forceps and snare cautery polypectomy. 2. Epinephrine injection and spot tattoo. 3. Esophagogastroduodenoscopy with cold forceps biopsy.  INDICATIONS:  Mr. [name] is a 66 year old male who presented with microcytic anemia.  His hemoglobin is 6.2 with a ferritin of 24 and a normal TIBC.  He takes aspirin chronically for coronary artery disease.  MEDICATIONS: 1. Colonoscopy:  Demerol 50 mg IV, Versed 4 mg IV. 2. Esophagogastroduodenoscopy:  Demerol 25 mg IV, Versed 1 mg IV.  PROCEDURE TECHNIQUE:  Physical exam was performed and informed consent was obtained from the patient.  I explained the benefits, risks, and alternatives of the procedure.  The patient was connected to the monitor and placed in left lateral position.  Continuous oxygen was provided by nasal cannula and IV medicine administered through an indwelling cannula.  After administration of sedation and rectal exam, the patient's rectum was intubated and scope was advanced under direct visualization to the ileum.  The scope was removed slowly by carefully examining of color, texture, anatomy, and integrity of mucosa on the way out.  After repositioning and administration of sedation, the patient's esophagus was intubated and scope was advanced under direct visualization to second portion of the duodenum.  The scope was removed slowly by carefully examining of the color, texture, anatomy, and integrity of the mucosa on the way out.  The patient  was recovered in endoscopy and discharged home in satisfactory condition.   PREP QUALITY:  Excellent. CECAL WITHDRAWAL TIME:  One hour and 3 minutes.  FINDINGS: 1. Patent peptic stricture.  Otherwise normal esophagus without     Barrett's. 2. Edematous, erythematous eroded area in the proximal stomach.     Biopsy via cold forceps.  Oozing was noted after biopsy.  In the     antrum, few erosions.  Biopsies obtained via cold forceps and sent     in a separate container. 3. Frequent duodenal erosions seen in the bulb and at the junction of     D1 and D2.  Normal second portion of the duodenum. 4. Multiple sessile colon polyps removed via snare cautery and cold     forceps.  Two 3-6 mm sessile polyps removed via snare cautery in     the cecum (20 watts) in cold forceps.  One 2 mm sessile ascending     colon polyp removed.  Three 2-4 mm sessile polyps removed in the     mid transverse colon.  Three 8 mm to 1.2 cm sessile polyps removed     in the descending colon.  The base of the larger polyp was injected     with epinephrine prior to excision and the  tattoo was spot.  The     large descending colon polyp was sent in a separate container.     Three 3-6 mm sessile sigmoid colon polyps removed. 5. Rare sigmoid colon diverticulosis. 6. Small internal hemorrhoids.  DIAGNOSIS:  1. MICROCYTIC ANEMIA MOST LIKE DUE TO COLON POLYPS & EROSIVE H PYLORI GASTRITIS, LESS LIKELY GASTRIC CA.  RECOMMENDATIONS: 1. Await biopsies. 2. No aspirin for 7 days.SCDs FOR DVT PROPHYLAXIS. 3. High-fiber heart healthy diet. 4. Dr. Karilyn Cota will resume care on August 19 at 07:30. 5. The patient will most likely need a colonoscopy in 1 year if he has     an advanced polyp and 3 years if he has simple adenomas.  He had a     total of 13 polyps removed.    Jonette Eva, M.D.     SF/MEDQ  D:  11/14/2011  T:  11/14/2011  Job:  [id number]  cc:   [name], MD Fax: [phone]  Lionel December, M.D. Fax:  [phone]

## 2011-11-15 LAB — BASIC METABOLIC PANEL
Chloride: 102 mEq/L (ref 96–112)
GFR calc Af Amer: 48 mL/min — ABNORMAL LOW (ref >90)
Potassium: 4.2 mEq/L (ref 3.5–5.1)
Sodium: 134 mEq/L — ABNORMAL LOW (ref 135–145)

## 2011-11-15 MED ORDER — FUROSEMIDE 40 MG PO TABS: 20.0000 mg | ORAL_TABLET | Freq: Every day | ORAL | Status: DC

## 2011-11-15 MED ORDER — PANTOPRAZOLE SODIUM 40 MG PO TBEC: 40.0000 mg | DELAYED_RELEASE_TABLET | ORAL | Status: DC

## 2011-11-15 MED ORDER — CARVEDILOL 6.25 MG PO TABS: 6.2500 mg | ORAL_TABLET | Freq: Two times a day (BID) | ORAL | Status: DC

## 2011-11-15 NOTE — Plan of Care (Signed)
Problem: Discharge Progression Outcomes Goal: Other Discharge Outcomes/Goals Outcome: Completed/Met Date Met:  11/15/11 Discharge instructions read to pt pt verbalized understanding of all   Discharged to home with family

## 2011-11-15 NOTE — Discharge Summary (Signed)
Physician Discharge Summary  Patient ID: [name] MRN: [id number] DOB/AGE: 08/20/1945 66 y.o. Primary Care Physician:No primary provider on file. Admit date: 11/10/2011 Discharge date: 11/15/2011    Discharge Diagnoses:  Microcytic anemia secondary to chronic GI Bleed  Active Problems:  Arteriosclerotic cardiovascular disease (ASCVD)  Hypertension  Hyperlipidemia  Anemia, iron deficiency  Acute diastolic congestive heart failure  Chronic kidney disease (CKD), stage III (moderate)   Medication List  As of 11/15/2011  8:17 AM   STOP taking these medications         aspirin EC 81 MG tablet         TAKE these medications         carvedilol 6.25 MG tablet   Commonly known as: COREG   Take 1 tablet (6.25 mg total) by mouth 2 (two) times daily with a meal.      furosemide 40 MG tablet   Commonly known as: LASIX   Take 0.5 tablets (20 mg total) by mouth daily.      pantoprazole 40 MG tablet   Commonly known as: PROTONIX   Take 1 tablet (40 mg total) by mouth 1 day or 1 dose.      potassium chloride SA 20 MEQ tablet   Commonly known as: K-DUR,KLOR-CON   Take 20 mEq by mouth daily.            Discharged Condition: Stable    Consults: GI / Cardiology  Significant Diagnostic Studies: Dg Chest 2 View  11/13/2011  *RADIOLOGY REPORT*  Clinical Data: Chest pain.  Low hemoglobin.  CHF.  Smoker.  CHEST - 2 VIEW  Comparison: 11/10/2011  Findings: Hyperinflation. Prior median sternotomy. Numerous leads and wires project over the chest.  Midline trachea.  Mild cardiomegaly with atherosclerosis in the transverse aorta. Improved to resolved bilateral pleural effusions.  There may be trace residual fluid on the lateral view. No pneumothorax. Improved to resolved interstitial edema with mild venous congestion persisting.  Improved bibasilar airspace disease.  Possible developing peripheral left upper lobe opacity.  IMPRESSION:  1.  Overall improved aeration with nearly completely  resolved interstitial edema/venous congestion and improved to resolved small bilateral pleural effusions. 2.  Possible developing left upper lobe peripheral airspace opacity.  Consider short-term radiographic follow-up. 3.  Underlying hyperinflation/COPD. 4.  Improved bibasilar atelectasis.  Original Report Authenticated By: Consuello Bossier, M.D.   Dg Chest Portable 1 View  11/10/2011  *RADIOLOGY REPORT*  Clinical Data: Shortness of breath.  Pedal edema.  Heart murmur.  PORTABLE CHEST - 1 VIEW 11/10/2011 1543 hours:  Comparison: Two-view chest x-ray 05/23/2006.  Portable chest x-rays 05/22/2006 dating back to 05/17/1006.  Findings: Prior sternotomy CABG.  Cardiac silhouette markedly enlarged, with interval increase in the heart size, even allowing for differences in technique.  Diffuse interstitial pulmonary edema superimposed upon emphysematous changes in the upper lobes, left greater than right.  Bilateral pleural effusions, right greater than left, with associated consolidation in the right lower lobe.  IMPRESSION: CHF, with marked cardiomegaly and moderate diffuse interstitial pulmonary edema, superimposed upon COPD/emphysema.  Bilateral pleural effusions, right greater than left, with associated passive atelectasis in the right lower lobe.  Original Report Authenticated By: Arnell Sieving, M.D.    Lab Results: Basic Metabolic Panel:  Basename 11/15/11 0524 11/14/11 0547  NA 134* 132*  K 4.2 4.1  CL 102 97  CO2 23 25  GLUCOSE 91 96  BUN 16 14  CREATININE 1.64* 1.66*  CALCIUM 9.4 10.0  MG -- --  PHOS -- --   Liver Function Tests: No results found for this basename: AST:2,ALT:2,ALKPHOS:2,BILITOT:2,PROT:2,ALBUMIN:2 in the last 72 hours   CBC:  Basename 11/14/11 0547 11/13/11 0600  WBC 11.5* 13.5*  NEUTROABS 8.6* 9.9*  HGB 9.5* 9.6*  HCT 34.0* 33.0*  MCV 64.6* 63.5*  PLT 152 143*    No results found for this or any previous visit (from the past 240 hour(s)).   Hospital  Course:  This is a 66 years old male patient with history of multiple medical illness was admitted due to sever microcytic anemia. He was transfused. GI and cardiology consult was done. He had EDG and Colonoscopy was done. Several polyps were removed. No active bleeding. Patient improved and discharged in stable condition.   Discharge Exam: Blood pressure 106/59, pulse 81, temperature 98.4 F (36.9 C), temperature source Oral, resp. rate 18, height 5\' 8"  (1.727 m), weight 66.316 kg (146 lb 3.2 oz), SpO2 97.00%.   Disposition: Home    Follow-up Information    Follow up with [facility], MD in 1 week.   Contact information:   [address] [place] [address] [phone]          Signed: Avon Gully   11/15/2011, 8:17 AM

## 2011-11-18 ENCOUNTER — Encounter (HOSPITAL_COMMUNITY): Payer: Self-pay | Admitting: Gastroenterology

## 2011-11-22 ENCOUNTER — Telehealth: Payer: Self-pay | Admitting: Gastroenterology

## 2011-11-22 NOTE — Telephone Encounter (Signed)
Pt aware of results 

## 2011-11-22 NOTE — Telephone Encounter (Signed)
Please call pt. He had simple adenomas removed from hIS colon. His stomach Bx shows gastritis. Continue PROTONIX prior to YOUR FIRST meal. I WILL SEND YOUR RESULTS TO DR. Karilyn Cota AND HE WILL LET YOU KNOW WHEN YOUR NEXT COLONOSCOPY SHOULD BE.

## 2011-11-25 ENCOUNTER — Encounter: Payer: Self-pay | Admitting: Adult Health

## 2011-11-30 ENCOUNTER — Encounter: Payer: Medicare Other | Admitting: Adult Health

## 2011-12-01 ENCOUNTER — Encounter: Payer: Self-pay | Admitting: Adult Health

## 2011-12-01 ENCOUNTER — Ambulatory Visit (INDEPENDENT_AMBULATORY_CARE_PROVIDER_SITE_OTHER): Payer: Medicare Other | Admitting: Adult Health

## 2011-12-01 ENCOUNTER — Other Ambulatory Visit: Payer: Self-pay | Admitting: *Deleted

## 2011-12-01 VITALS — BP 114/60 | HR 69 | Ht 68.0 in | Wt 148.0 lb

## 2011-12-01 DIAGNOSIS — I10 Essential (primary) hypertension: Secondary | ICD-10-CM

## 2011-12-01 DIAGNOSIS — D509 Iron deficiency anemia, unspecified: Secondary | ICD-10-CM

## 2011-12-01 DIAGNOSIS — I5031 Acute diastolic (congestive) heart failure: Secondary | ICD-10-CM

## 2011-12-01 DIAGNOSIS — I509 Heart failure, unspecified: Secondary | ICD-10-CM

## 2011-12-01 MED ORDER — POTASSIUM CHLORIDE CRYS ER 20 MEQ PO TBCR: 20.0000 meq | EXTENDED_RELEASE_TABLET | Freq: Every day | ORAL | Status: DC

## 2011-12-01 MED ORDER — FERROUS SULFATE 325 (65 FE) MG PO TABS: 325.0000 mg | ORAL_TABLET | Freq: Every day | ORAL | Status: DC

## 2011-12-01 MED ORDER — FUROSEMIDE 40 MG PO TABS: 20.0000 mg | ORAL_TABLET | Freq: Every day | ORAL | Status: DC

## 2011-12-01 NOTE — Patient Instructions (Addendum)
Your physician wants you to follow-up in: 4 months with Joni Reining, NP.  You will receive a reminder letter in the mail two months in advance. If you don't receive a letter, please call our office to schedule the follow-up appointment.

## 2011-12-01 NOTE — Progress Notes (Signed)
   HPI: [name] is a 66 year old patient of Dr. Dietrich Pates we are seeing posthospitalization after consultation for congestive heart failure. The patient has a history of three-vessel coronary artery disease in 2008 and underwent uncomplicated CABG surgery. On admission the patient was found to have severe anemia with a hemoglobin of 6.3. He received blood transfusions. He was also found to have pulmonary edema which improved with diuresis and transfusion. He anemia was diagnosed as iron deficiency he was placed on iron replacement therapy. Echocardiogram was completed during hospitalization revealing mildly reduced systolic function of 45-50% with moderate to severe hypokinesis and thinning of the basal-mid inferior lateral myocardium. He has been feeling fatigued but not having any overt symptoms of dyspnea on exertion, chest pain, or or edema. He remains medically compliant.  No Known Allergies  Current Outpatient Prescriptions  Medication Sig Dispense Refill  . carvedilol (COREG) 6.25 MG tablet Take 1 tablet (6.25 mg total) by mouth 2 (two) times daily with a meal.  60 tablet  3  . furosemide (LASIX) 40 MG tablet Take 0.5 tablets (20 mg total) by mouth daily.  30 tablet  3  . pantoprazole (PROTONIX) 40 MG tablet Take 1 tablet (40 mg total) by mouth 1 day or 1 dose.  30 tablet  3  . potassium chloride SA (K-DUR,KLOR-CON) 20 MEQ tablet Take 1 tablet (20 mEq total) by mouth daily.  30 tablet  3  . DISCONTD: furosemide (LASIX) 40 MG tablet Take 0.5 tablets (20 mg total) by mouth daily.  30 tablet  3  . DISCONTD: potassium chloride SA (K-DUR,KLOR-CON) 20 MEQ tablet Take 20 mEq by mouth daily.        Past Medical History  Diagnosis Date  . Arteriosclerotic cardiovascular disease (ASCVD) 2005    Presented with cardiac arrest and was found to have inferobasilar scarring without critical coronary disease; catheterization in 2008 revealed three-vessel disease prompting CABG surgery  . Hypertension   .  Hyperlipidemia   . GERD (gastroesophageal reflux disease)     Past Surgical History  Procedure Date  . Coronary artery bypass graft 2008  . Cholecystectomy   . Soft tissue cyst excision     Forehead  . Esophagogastroduodenoscopy 11/14/2011    Procedure: ESOPHAGOGASTRODUODENOSCOPY (EGD);  Surgeon: West Bali, MD;  Location: [facility];  Service: Endoscopy;  Laterality: N/A;  . Colonoscopy 11/14/2011    Procedure: COLONOSCOPY;  Surgeon: West Bali, MD;  Location: [facility];  Service: Endoscopy;  Laterality: N/A;    WUJ:WJXBJY of systems complete and found to be negative unless listed above  PHYSICAL EXAM BP 114/60  Pulse 69  Ht 5\' 8"  (1.727 m)  Wt 148 lb (67.132 kg)  BMI 22.50 kg/m2  General: Well developed, well nourished, in no acute distress Head: Eyes PERRLA, No xanthomas.   Normal cephalic and atramatic  Lungs: Clear bilaterally to auscultation and percussion. Heart: HRRR S1 S2,distant. No S3 is auscultated. without MRG.  Pulses are 2+ & equal.No carotid bruit. No JVD.  No abdominal bruits. No femoral bruits. Abdomen: Bowel sounds are positive, abdomen soft and non-tender without masses or   Hernia's noted. Msk:  Back normal, normal gait. Normal strength and tone for age. Extremities: No clubbing, cyanosis or edema.  DP +1 Neuro: Alert and oriented X 3. Psych:  Good affect, responds appropriately :  ASSESSMENT AND PLAN

## 2011-12-01 NOTE — Assessment & Plan Note (Signed)
This is newly diagnosed during hospitalization the do not see that the patient is on any iron replacement. It is recommended that the patient followup with primary care for iron replacement therapy or referral to hematology.

## 2011-12-01 NOTE — Assessment & Plan Note (Signed)
The patient is currently asymptomatic medically compliant and without any evidence of fluid retention. He has decided to go ahead and retire from his workplace as he did not feel returning to work is something he can physically do. He is been offered early retirement. He will continue his current medications without changes. He will followup with Dr. Felecia Shelling for continued evaluation of anemia and iron replacement. Currently do not see these on some iron replacement. This will need to be readdressed on followup.

## 2011-12-01 NOTE — Addendum Note (Signed)
Addended by: Waymon Budge on: 12/01/2011 04:14 PM   Modules accepted: Orders

## 2011-12-01 NOTE — Assessment & Plan Note (Signed)
Blood pressure is currently excellently controlled. We will make no changes at this time. He money to have followup labs to include a CBC and CMET.

## 2011-12-03 LAB — COMPREHENSIVE METABOLIC PANEL
AST: 9 U/L (ref 0–37)
Albumin: 3.4 g/dL — ABNORMAL LOW (ref 3.5–5.2)
BUN: 12 mg/dL (ref 6–23)
Calcium: 8.9 mg/dL (ref 8.4–10.5)
Chloride: 102 mEq/L (ref 96–112)
Potassium: 4.3 mEq/L (ref 3.5–5.3)
Sodium: 135 mEq/L (ref 135–145)
Total Protein: 7.1 g/dL (ref 6.0–8.3)

## 2011-12-03 LAB — CBC WITH DIFFERENTIAL/PLATELET
Basophils Absolute: 0.1 10*3/uL (ref 0.0–0.1)
HCT: 32.3 % — ABNORMAL LOW (ref 39.0–52.0)
Hemoglobin: 9.1 g/dL — ABNORMAL LOW (ref 13.0–17.0)
Lymphocytes Relative: 16 % (ref 12–46)
Monocytes Absolute: 0.6 10*3/uL (ref 0.1–1.0)
Monocytes Relative: 6 % (ref 3–12)
Neutro Abs: 7.5 10*3/uL (ref 1.7–7.7)
RDW: 26.7 % — ABNORMAL HIGH (ref 11.5–15.5)
WBC: 10.1 10*3/uL (ref 4.0–10.5)

## 2011-12-05 NOTE — Telephone Encounter (Signed)
Results once again reviewed with patient. Next colonoscopy in 3 years

## 2011-12-07 NOTE — Telephone Encounter (Signed)
3 yr TCS noted

## 2013-04-05 ENCOUNTER — Inpatient Hospital Stay (HOSPITAL_COMMUNITY)
Admission: EM | Admit: 2013-04-05 | Discharge: 2013-04-10 | DRG: 208 | Disposition: A | Discharge status: Home Health | Payer: Medicare Other | Attending: Internal Medicine | Admitting: Internal Medicine

## 2013-04-05 ENCOUNTER — Other Ambulatory Visit: Payer: Self-pay

## 2013-04-05 ENCOUNTER — Emergency Department (HOSPITAL_COMMUNITY): Payer: Medicare Other

## 2013-04-05 ENCOUNTER — Encounter (HOSPITAL_COMMUNITY): Payer: Self-pay | Admitting: Emergency Medicine

## 2013-04-05 DIAGNOSIS — Z23 Encounter for immunization: Secondary | ICD-10-CM

## 2013-04-05 DIAGNOSIS — E785 Hyperlipidemia, unspecified: Secondary | ICD-10-CM | POA: Diagnosis present

## 2013-04-05 DIAGNOSIS — I509 Heart failure, unspecified: Secondary | ICD-10-CM | POA: Diagnosis present

## 2013-04-05 DIAGNOSIS — I129 Hypertensive chronic kidney disease with stage 1 through stage 4 chronic kidney disease, or unspecified chronic kidney disease: Secondary | ICD-10-CM | POA: Diagnosis present

## 2013-04-05 DIAGNOSIS — J4489 Other specified chronic obstructive pulmonary disease: Secondary | ICD-10-CM | POA: Diagnosis present

## 2013-04-05 DIAGNOSIS — K219 Gastro-esophageal reflux disease without esophagitis: Secondary | ICD-10-CM | POA: Diagnosis present

## 2013-04-05 DIAGNOSIS — N390 Urinary tract infection, site not specified: Secondary | ICD-10-CM

## 2013-04-05 DIAGNOSIS — I5031 Acute diastolic (congestive) heart failure: Secondary | ICD-10-CM

## 2013-04-05 DIAGNOSIS — J811 Chronic pulmonary edema: Secondary | ICD-10-CM

## 2013-04-05 DIAGNOSIS — E876 Hypokalemia: Secondary | ICD-10-CM | POA: Diagnosis present

## 2013-04-05 DIAGNOSIS — N2581 Secondary hyperparathyroidism of renal origin: Secondary | ICD-10-CM | POA: Diagnosis present

## 2013-04-05 DIAGNOSIS — I5021 Acute systolic (congestive) heart failure: Secondary | ICD-10-CM | POA: Diagnosis present

## 2013-04-05 DIAGNOSIS — E119 Type 2 diabetes mellitus without complications: Secondary | ICD-10-CM | POA: Diagnosis present

## 2013-04-05 DIAGNOSIS — K2991 Gastroduodenitis, unspecified, with bleeding: Secondary | ICD-10-CM

## 2013-04-05 DIAGNOSIS — Z951 Presence of aortocoronary bypass graft: Secondary | ICD-10-CM

## 2013-04-05 DIAGNOSIS — F17200 Nicotine dependence, unspecified, uncomplicated: Secondary | ICD-10-CM | POA: Diagnosis present

## 2013-04-05 DIAGNOSIS — D509 Iron deficiency anemia, unspecified: Secondary | ICD-10-CM | POA: Diagnosis present

## 2013-04-05 DIAGNOSIS — N289 Disorder of kidney and ureter, unspecified: Secondary | ICD-10-CM

## 2013-04-05 DIAGNOSIS — R778 Other specified abnormalities of plasma proteins: Secondary | ICD-10-CM

## 2013-04-05 DIAGNOSIS — D696 Thrombocytopenia, unspecified: Secondary | ICD-10-CM | POA: Diagnosis present

## 2013-04-05 DIAGNOSIS — R7989 Other specified abnormal findings of blood chemistry: Secondary | ICD-10-CM

## 2013-04-05 DIAGNOSIS — R809 Proteinuria, unspecified: Secondary | ICD-10-CM | POA: Diagnosis present

## 2013-04-05 DIAGNOSIS — N179 Acute kidney failure, unspecified: Secondary | ICD-10-CM | POA: Diagnosis present

## 2013-04-05 DIAGNOSIS — I2510 Atherosclerotic heart disease of native coronary artery without angina pectoris: Secondary | ICD-10-CM | POA: Diagnosis present

## 2013-04-05 DIAGNOSIS — J449 Chronic obstructive pulmonary disease, unspecified: Secondary | ICD-10-CM | POA: Diagnosis present

## 2013-04-05 DIAGNOSIS — N183 Chronic kidney disease, stage 3 unspecified: Secondary | ICD-10-CM | POA: Diagnosis present

## 2013-04-05 DIAGNOSIS — J9600 Acute respiratory failure, unspecified whether with hypoxia or hypercapnia: Principal | ICD-10-CM | POA: Diagnosis present

## 2013-04-05 DIAGNOSIS — D649 Anemia, unspecified: Secondary | ICD-10-CM

## 2013-04-05 DIAGNOSIS — K2971 Gastritis, unspecified, with bleeding: Secondary | ICD-10-CM | POA: Diagnosis present

## 2013-04-05 DIAGNOSIS — E559 Vitamin D deficiency, unspecified: Secondary | ICD-10-CM | POA: Diagnosis present

## 2013-04-05 LAB — BASIC METABOLIC PANEL
BUN: 23 mg/dL (ref 6–23)
CO2: 19 meq/L (ref 19–32)
CREATININE: 1.85 mg/dL — AB (ref 0.50–1.35)
Calcium: 8.8 mg/dL (ref 8.4–10.5)
Chloride: 102 mEq/L (ref 96–112)
GFR calc Af Amer: 41 mL/min — ABNORMAL LOW (ref >90)
GFR calc non Af Amer: 36 mL/min — ABNORMAL LOW (ref >90)
GLUCOSE: 296 mg/dL — AB (ref 70–99)
Potassium: 3.4 mEq/L — ABNORMAL LOW (ref 3.7–5.3)
Sodium: 138 mEq/L (ref 137–147)

## 2013-04-05 LAB — BLOOD GAS, ARTERIAL
ACID-BASE DEFICIT: 6.5 mmol/L — AB (ref 0.0–2.0)
Acid-base deficit: 13.8 mmol/L — ABNORMAL HIGH (ref 0.0–2.0)
Bicarbonate: 16.3 mEq/L — ABNORMAL LOW (ref 20.0–24.0)
Bicarbonate: 19.4 mEq/L — ABNORMAL LOW (ref 20.0–24.0)
DRAWN BY: 234301
Drawn by: 213101
FIO2: 50 %
LHR: 15 {breaths}/min
O2 Content: 8 L/min
O2 SAT: 97.7 %
O2 Saturation: 84.6 %
PATIENT TEMPERATURE: 36.7
PCO2 ART: 76 mmHg — AB (ref 35.0–45.0)
PEEP/CPAP: 5 cmH2O
PH ART: 6.962 — AB (ref 7.350–7.450)
Patient temperature: 37
TCO2: 17.3 mmol/L (ref 0–100)
TCO2: 19 mmol/L (ref 0–100)
VT: 500 mL
pCO2 arterial: 44.6 mmHg (ref 35.0–45.0)
pH, Arterial: 7.26 — ABNORMAL LOW (ref 7.350–7.450)
pO2, Arterial: 81.3 mmHg (ref 80.0–100.0)
pO2, Arterial: 114 mmHg — ABNORMAL HIGH (ref 80.0–100.0)

## 2013-04-05 LAB — CBC WITH DIFFERENTIAL/PLATELET
BASOS ABS: 0.2 10*3/uL — AB (ref 0.0–0.1)
BASOS PCT: 1 % (ref 0–1)
EOS ABS: 0.2 10*3/uL (ref 0.0–0.7)
Eosinophils Relative: 1 % (ref 0–5)
HCT: 31.3 % — ABNORMAL LOW (ref 39.0–52.0)
HEMOGLOBIN: 8.9 g/dL — AB (ref 13.0–17.0)
LYMPHS ABS: 6.3 10*3/uL — AB (ref 0.7–4.0)
LYMPHS PCT: 36 % (ref 12–46)
MCH: 20.7 pg — ABNORMAL LOW (ref 26.0–34.0)
MCHC: 28.4 g/dL — AB (ref 30.0–36.0)
MCV: 73 fL — AB (ref 78.0–100.0)
MONO ABS: 1.2 10*3/uL — AB (ref 0.1–1.0)
Monocytes Relative: 7 % (ref 3–12)
NEUTROS ABS: 9.6 10*3/uL — AB (ref 1.7–7.7)
Neutrophils Relative %: 55 % (ref 43–77)
Platelets: 86 10*3/uL — ABNORMAL LOW (ref 150–400)
RBC: 4.29 MIL/uL (ref 4.22–5.81)
RDW: 18.2 % — ABNORMAL HIGH (ref 11.5–15.5)
WBC: 17.5 10*3/uL — ABNORMAL HIGH (ref 4.0–10.5)

## 2013-04-05 LAB — URINALYSIS, ROUTINE W REFLEX MICROSCOPIC
BILIRUBIN URINE: NEGATIVE
Glucose, UA: 100 mg/dL — AB
KETONES UR: NEGATIVE mg/dL
Leukocytes, UA: NEGATIVE
Nitrite: NEGATIVE
PH: 6 (ref 5.0–8.0)
Protein, ur: 100 mg/dL — AB
SPECIFIC GRAVITY, URINE: 1.025 (ref 1.005–1.030)
Urobilinogen, UA: 0.2 mg/dL (ref 0.0–1.0)

## 2013-04-05 LAB — GLUCOSE, CAPILLARY: Glucose-Capillary: 124 mg/dL — ABNORMAL HIGH (ref 70–99)

## 2013-04-05 LAB — RETICULOCYTES
RBC.: 4.47 MIL/uL (ref 4.22–5.81)
Retic Count, Absolute: 147.5 10*3/uL (ref 19.0–186.0)
Retic Ct Pct: 3.3 % — ABNORMAL HIGH (ref 0.4–3.1)

## 2013-04-05 LAB — URINE MICROSCOPIC-ADD ON

## 2013-04-05 LAB — CARBOXYHEMOGLOBIN
Carboxyhemoglobin: 2.9 % — ABNORMAL HIGH (ref 0.5–1.5)
Methemoglobin: 1.2 % (ref 0.0–1.5)
O2 SAT: 99.4 %
TOTAL OXYGEN CONTENT: 13.1 mL/dL — AB (ref 15.0–23.0)
Total hemoglobin: 9 g/dL — ABNORMAL LOW (ref 13.5–18.0)

## 2013-04-05 LAB — PRO B NATRIURETIC PEPTIDE: Pro B Natriuretic peptide (BNP): 2769 pg/mL — ABNORMAL HIGH (ref 0–125)

## 2013-04-05 LAB — TROPONIN I

## 2013-04-05 LAB — MRSA PCR SCREENING: MRSA by PCR: NEGATIVE

## 2013-04-05 MED ORDER — ENOXAPARIN SODIUM 40 MG/0.4ML ~~LOC~~ SOLN
40.0000 mg | SUBCUTANEOUS | Status: DC
  Administered 2013-04-05: 40 mg via SUBCUTANEOUS
  Filled 2013-04-05: qty 0.4

## 2013-04-05 MED ORDER — IPRATROPIUM-ALBUTEROL 0.5-2.5 (3) MG/3ML IN SOLN
3.0000 mL | RESPIRATORY_TRACT | Status: DC
Start: 2013-04-05 — End: 2013-04-09
  Administered 2013-04-05 – 2013-04-08 (×22): 3 mL via RESPIRATORY_TRACT
  Filled 2013-04-05 (×22): qty 3

## 2013-04-05 MED ORDER — ALBUTEROL SULFATE (2.5 MG/3ML) 0.083% IN NEBU
2.5000 mg | INHALATION_SOLUTION | RESPIRATORY_TRACT | Status: DC
  Filled 2013-04-05: qty 3

## 2013-04-05 MED ORDER — CHLORHEXIDINE GLUCONATE 0.12 % MT SOLN
15.0000 mL | Freq: Two times a day (BID) | OROMUCOSAL | Status: DC
  Administered 2013-04-05 – 2013-04-07 (×6): 15 mL via OROMUCOSAL
  Filled 2013-04-05 (×5): qty 15

## 2013-04-05 MED ORDER — LIDOCAINE HCL (CARDIAC) 20 MG/ML IV SOLN
INTRAVENOUS | Status: AC
  Filled 2013-04-05: qty 5

## 2013-04-05 MED ORDER — INFLUENZA VAC SPLIT QUAD 0.5 ML IM SUSP
0.5000 mL | INTRAMUSCULAR | Status: AC
  Administered 2013-04-06: 0.5 mL via INTRAMUSCULAR
  Filled 2013-04-05: qty 0.5

## 2013-04-05 MED ORDER — CEFTRIAXONE SODIUM 1 G IJ SOLR
1.0000 g | INTRAMUSCULAR | Status: DC
  Administered 2013-04-06 – 2013-04-09 (×4): 1 g via INTRAVENOUS
  Filled 2013-04-05 (×6): qty 10

## 2013-04-05 MED ORDER — PROPOFOL 10 MG/ML IV EMUL
INTRAVENOUS | Status: DC
  Administered 2013-04-05: 5 ug/kg/min via INTRAVENOUS
  Administered 2013-04-05 (×3): 70 mg/kg/h via INTRAVENOUS
  Administered 2013-04-06 (×2): 1000 mg via INTRAVENOUS
  Administered 2013-04-06 (×2): 60 ug/kg/min via INTRAVENOUS
  Filled 2013-04-05 (×7): qty 100

## 2013-04-05 MED ORDER — PROPOFOL 10 MG/ML IV EMUL
INTRAVENOUS | Status: AC
  Administered 2013-04-05: 50 mg/h
  Filled 2013-04-05: qty 100

## 2013-04-05 MED ORDER — SODIUM CHLORIDE 0.9 % IV SOLN
Freq: Once | INTRAVENOUS | Status: AC
  Administered 2013-04-05: 100 mL/h via INTRAVENOUS

## 2013-04-05 MED ORDER — PNEUMOCOCCAL VAC POLYVALENT 25 MCG/0.5ML IJ INJ
0.5000 mL | INJECTION | INTRAMUSCULAR | Status: AC
  Administered 2013-04-06: 0.5 mL via INTRAMUSCULAR
  Filled 2013-04-05: qty 0.5

## 2013-04-05 MED ORDER — IPRATROPIUM BROMIDE 0.02 % IN SOLN
0.5000 mg | Freq: Once | RESPIRATORY_TRACT | Status: AC
  Administered 2013-04-05: 0.5 mg via RESPIRATORY_TRACT
  Filled 2013-04-05: qty 2.5

## 2013-04-05 MED ORDER — BIOTENE DRY MOUTH MT LIQD
15.0000 mL | Freq: Four times a day (QID) | OROMUCOSAL | Status: DC
  Administered 2013-04-05 – 2013-04-07 (×10): 15 mL via OROMUCOSAL

## 2013-04-05 MED ORDER — FUROSEMIDE 10 MG/ML IJ SOLN
40.0000 mg | Freq: Once | INTRAMUSCULAR | Status: AC
  Administered 2013-04-05: 40 mg via INTRAVENOUS
  Filled 2013-04-05: qty 4

## 2013-04-05 MED ORDER — ROCURONIUM BROMIDE 50 MG/5ML IV SOLN
INTRAVENOUS | Status: AC
  Filled 2013-04-05: qty 2

## 2013-04-05 MED ORDER — IPRATROPIUM BROMIDE 0.02 % IN SOLN
0.5000 mg | RESPIRATORY_TRACT | Status: DC
  Filled 2013-04-05: qty 2.5

## 2013-04-05 MED ORDER — ETOMIDATE 2 MG/ML IV SOLN
INTRAVENOUS | Status: AC
  Administered 2013-04-05: 20 mg via INTRAVENOUS
  Filled 2013-04-05: qty 20

## 2013-04-05 MED ORDER — SUCCINYLCHOLINE CHLORIDE 20 MG/ML IJ SOLN
INTRAMUSCULAR | Status: AC
  Filled 2013-04-05: qty 1

## 2013-04-05 MED ORDER — PROPOFOL 10 MG/ML IV EMUL
INTRAVENOUS | Status: AC
  Filled 2013-04-05: qty 100

## 2013-04-05 MED ORDER — ALBUTEROL SULFATE (2.5 MG/3ML) 0.083% IN NEBU
2.5000 mg | INHALATION_SOLUTION | Freq: Once | RESPIRATORY_TRACT | Status: AC
  Administered 2013-04-05: 2.5 mg via RESPIRATORY_TRACT
  Filled 2013-04-05: qty 3

## 2013-04-05 MED ORDER — ETOMIDATE 2 MG/ML IV SOLN
INTRAVENOUS | Status: AC
  Filled 2013-04-05: qty 20

## 2013-04-05 MED ORDER — DEXTROSE 5 % IV SOLN
1.0000 g | Freq: Once | INTRAVENOUS | Status: AC
  Administered 2013-04-05: 1 g via INTRAVENOUS
  Filled 2013-04-05: qty 10

## 2013-04-05 MED ORDER — SUCCINYLCHOLINE CHLORIDE 20 MG/ML IJ SOLN
INTRAMUSCULAR | Status: AC
  Administered 2013-04-05: 100 mg via INTRAVENOUS
  Filled 2013-04-05: qty 1

## 2013-04-05 MED ORDER — INSULIN ASPART 100 UNIT/ML ~~LOC~~ SOLN
0.0000 [IU] | SUBCUTANEOUS | Status: DC
  Administered 2013-04-05: 1 [IU] via SUBCUTANEOUS

## 2013-04-05 MED ORDER — PANTOPRAZOLE SODIUM 40 MG IV SOLR
40.0000 mg | Freq: Every day | INTRAVENOUS | Status: DC
  Administered 2013-04-05 – 2013-04-06 (×2): 40 mg via INTRAVENOUS
  Filled 2013-04-05 (×2): qty 40

## 2013-04-05 MED ORDER — PROPOFOL 10 MG/ML IV EMUL
5.0000 ug/min | Freq: Once | INTRAVENOUS | Status: AC
  Administered 2013-04-05: 70 ug/min via INTRAVENOUS
  Filled 2013-04-05: qty 100

## 2013-04-05 MED ORDER — FUROSEMIDE 10 MG/ML IJ SOLN
40.0000 mg | Freq: Three times a day (TID) | INTRAMUSCULAR | Status: DC
  Administered 2013-04-05 – 2013-04-10 (×15): 40 mg via INTRAVENOUS
  Filled 2013-04-05 (×18): qty 4

## 2013-04-05 NOTE — Consult Note (Signed)
Consult requested by: Dr. Legrand Rams Consult requested for respiratory failure:  HPI: This is a 68 year old who was found in distress in his apartment using a kerosene heater. He was in respiratory distress treated with nebulizer treatments and steroids and improved enough for transport but had to be intubated once he came to the emergency department. History is from the medical record because he's not able to provide any history and there is no family present.  Past Medical History  Diagnosis Date  . Arteriosclerotic cardiovascular disease (ASCVD) 2005    Presented with cardiac arrest and was found to have inferobasilar scarring without critical coronary disease; catheterization in 2008 revealed three-vessel disease prompting CABG surgery  . Hypertension   . Hyperlipidemia   . GERD (gastroesophageal reflux disease)      No family history on file.   History   Social History  . Marital Status: Single    Spouse Name: N/A    Number of Children: N/A  . Years of Education: N/A   Social History Main Topics  . Smoking status: Current Every Day Smoker  . Smokeless tobacco: None  . Alcohol Use: Yes     Comment: Occ  . Drug Use: None  . Sexual Activity: None   Other Topics Concern  . None   Social History Narrative  . None     ROS: Unobtainable    Objective: Vital signs in last 24 hours: Temp:  [97.9 F (36.6 C)] 97.9 F (36.6 C) (01/08 0828) Pulse Rate:  [87-129] 87 (01/08 0830) Resp:  [21-40] 24 (01/08 0830) BP: (102-172)/(54-105) 102/54 mmHg (01/08 0830) SpO2:  [82 %-100 %] 100 % (01/08 0830) FiO2 (%):  [100 %] 100 % (01/08 0721) Weight change:     Intake/Output from previous day:    PHYSICAL EXAM He is intubated and sedated. His pupils respond. His nose and throat clear. Neck is supple. His chest shows rhonchi bilaterally and rales in the bases. His heart is regular. He has a history of coronary artery bypass grafting and has a midline sternotomy scar. His abdomen  is soft. Extremities show only minimal edema. Central nervous system exam shows he is sedated  Lab Results: Basic Metabolic Panel:  Recent Labs  04/05/13 0638  NA 138  K 3.4*  CL 102  CO2 19  GLUCOSE 296*  BUN 23  CREATININE 1.85*  CALCIUM 8.8   Liver Function Tests: No results found for this basename: AST, ALT, ALKPHOS, BILITOT, PROT, ALBUMIN,  in the last 72 hours No results found for this basename: LIPASE, AMYLASE,  in the last 72 hours No results found for this basename: AMMONIA,  in the last 72 hours CBC:  Recent Labs  04/05/13 0638  WBC 17.5*  NEUTROABS 9.6*  HGB 8.9*  HCT 31.3*  MCV 73.0*  PLT 86*   Cardiac Enzymes:  Recent Labs  04/05/13 0638  TROPONINI <0.30   BNP:  Recent Labs  04/05/13 0638  PROBNP 2769.0*   D-Dimer: No results found for this basename: DDIMER,  in the last 72 hours CBG: No results found for this basename: GLUCAP,  in the last 72 hours Hemoglobin A1C: No results found for this basename: HGBA1C,  in the last 72 hours Fasting Lipid Panel: No results found for this basename: CHOL, HDL, LDLCALC, TRIG, CHOLHDL, LDLDIRECT,  in the last 72 hours Thyroid Function Tests: No results found for this basename: TSH, T4TOTAL, FREET4, T3FREE, THYROIDAB,  in the last 72 hours Anemia Panel: No results found for this  basename: VITAMINB12, FOLATE, FERRITIN, TIBC, IRON, RETICCTPCT,  in the last 72 hours Coagulation: No results found for this basename: LABPROT, INR,  in the last 72 hours Urine Drug Screen: Drugs of Abuse  No results found for this basename: labopia, cocainscrnur, labbenz, amphetmu, thcu, labbarb    Alcohol Level: No results found for this basename: ETH,  in the last 72 hours Urinalysis:  Recent Labs  04/05/13 0727  COLORURINE YELLOW  LABSPEC 1.025  PHURINE 6.0  GLUCOSEU 100*  HGBUR SMALL*  BILIRUBINUR NEGATIVE  KETONESUR NEGATIVE  PROTEINUR 100*  UROBILINOGEN 0.2  NITRITE NEGATIVE  LEUKOCYTESUR NEGATIVE   Misc.  Labs:   ABGS:  Recent Labs  04/05/13 0627  PHART 6.962*  PO2ART 81.3  TCO2 17.3  HCO3 16.3*     MICROBIOLOGY: No results found for this or any previous visit (from the past 240 hour(s)).  Studies/Results: Dg Chest Port 1 View  04/05/2013   CLINICAL DATA:  Intubation  EXAM: PORTABLE CHEST - 1 VIEW  COMPARISON:  04/05/2013  FINDINGS: Endotracheal tube in satisfactory position. NG tube is in place with the tip not visualized.  Severe bilateral airspace disease is unchanged and most consistent with edema.  IMPRESSION: Endotracheal tube in satisfactory position  No change in severe bilateral edema.   Electronically Signed   By: Franchot Gallo M.D.   On: 04/05/2013 07:25   Dg Chest Port 1 View  04/05/2013   CLINICAL DATA:  Respiratory distress.  History of CABG  EXAM: PORTABLE CHEST - 1 VIEW  COMPARISON:  11/13/2011  FINDINGS: Interstitial and alveolar edema. Small bilateral pleural effusions. Background COPD with chronic lung hyperinflation. Cardiomegaly, status post CABG.  IMPRESSION: CHF.   Electronically Signed   By: Jorje Guild M.D.   On: 04/05/2013 07:04    Medications:  Scheduled: . etomidate      . lidocaine (cardiac) 100 mg/74ml      . lidocaine (cardiac) 100 mg/5ml      . rocuronium      . rocuronium      . succinylcholine       Continuous: . cefTRIAXone (ROCEPHIN) IVPB 1 gram/50 mL D5W 1 g (04/05/13 0820)  . propofol    . propofol     PRN:  Assesment: He is admitted with respiratory failure that seems to be related to congestive heart failure. It is not known if he has any history of lung disease. Active Problems:   CHF (congestive heart failure)    Plan: He will be diuresed maintain on the ventilator for now. I'm going to leave him on antibiotics and nebulizer treatments.    LOS: 0 days   Karol Skarzynski L 04/05/2013, 8:44 AM

## 2013-04-05 NOTE — H&P (Signed)
[name] MRN: [id number] DOB/AGE: [birth year] 69 y.o. Primary Care Physician:Tatum Corl, MD Admit date: 04/05/2013 Chief Complaint:  Respiratory failure HPI:  This is a 68 years old male with history of multiple medical illnesses was brought to Er due to respiratory distress. Patient was found in his apartment with respiratory distress. He was tried to be treated with oxygen and nebulizer treatment but his condition got and he was intubated in ER. Patient is currently sedated and on ventilatory support.  Patient is unable to give any history.  Past Medical History  Diagnosis Date  . Arteriosclerotic cardiovascular disease (ASCVD) 2005    Presented with cardiac arrest and was found to have inferobasilar scarring without critical coronary disease; catheterization in 2008 revealed three-vessel disease prompting CABG surgery  . Hypertension   . Hyperlipidemia   . GERD (gastroesophageal reflux disease)    Past Surgical History  Procedure Laterality Date  . Coronary artery bypass graft  2008  . Cholecystectomy    . Soft tissue cyst excision      Forehead  . Esophagogastroduodenoscopy  11/14/2011    Procedure: ESOPHAGOGASTRODUODENOSCOPY (EGD);  Surgeon: Danie Binder, MD;  Location: [facility];  Service: Endoscopy;  Laterality: N/A;  . Colonoscopy  11/14/2011    Procedure: COLONOSCOPY;  Surgeon: Danie Binder, MD;  Location: [facility];  Service: Endoscopy;  Laterality: N/A;        No family history on file.  Social History:  reports that he has been smoking.  He does not have any smokeless tobacco history on file. He reports that he drinks alcohol. His drug history is not on file.   Allergies: No Known Allergies  Medications Prior to Admission  Medication Sig Dispense Refill  . atenolol (TENORMIN) 25 MG tablet Take 25 mg by mouth 2 (two) times daily.           WUJ:WJXBJ from the symptoms mentioned above,there are no other symptoms referable to all systems  reviewed.  Physical Exam: Blood pressure 104/58, pulse 70, temperature 97.2 F (36.2 C), temperature source Core (Comment), resp. rate 21, height 5\' 10"  (1.778 m), weight 65.4 kg (144 lb 2.9 oz), SpO2 100.00%. General Condition- Sedated and on ventilatory support HE ENT- pupils equal and reactive, neck supple Chest- decreased air entry, bilateral rhonchi CVS- S1 and S2 heard, regular Abd- soft and lax, bowel sound EXT- no leg edema    Recent Labs  04/05/13 0638  WBC 17.5*  NEUTROABS 9.6*  HGB 8.9*  HCT 31.3*  MCV 73.0*  PLT 86*    Recent Labs  04/05/13 0638  NA 138  K 3.4*  CL 102  CO2 19  GLUCOSE 296*  BUN 23  CREATININE 1.85*  CALCIUM 8.8  lablast2(ast:2,ALT:2,alkphos:2,bilitot:2,prot:2,albumin:2)@    Recent Results (from the past 240 hour(s))  MRSA PCR SCREENING     Status: None   Collection Time    04/05/13 12:30 PM      Result Value Range Status   MRSA by PCR NEGATIVE  NEGATIVE Final   Comment:            The GeneXpert MRSA Assay (FDA     approved for NASAL specimens     only), is one component of a     comprehensive MRSA colonization     surveillance program. It is not     intended to diagnose MRSA     infection nor to guide or     monitor treatment for     MRSA  infections.     Dg Chest Port 1 View  04/05/2013   CLINICAL DATA:  Intubation  EXAM: PORTABLE CHEST - 1 VIEW  COMPARISON:  04/05/2013  FINDINGS: Endotracheal tube in satisfactory position. NG tube is in place with the tip not visualized.  Severe bilateral airspace disease is unchanged and most consistent with edema.  IMPRESSION: Endotracheal tube in satisfactory position  No change in severe bilateral edema.   Electronically Signed   By: Franchot Gallo M.D.   On: 04/05/2013 07:25   Dg Chest Port 1 View  04/05/2013   CLINICAL DATA:  Respiratory distress.  History of CABG  EXAM: PORTABLE CHEST - 1 VIEW  COMPARISON:  11/13/2011  FINDINGS: Interstitial and alveolar edema. Small bilateral pleural  effusions. Background COPD with chronic lung hyperinflation. Cardiomegaly, status post CABG.  IMPRESSION: CHF.   Electronically Signed   By: Jorje Guild M.D.   On: 04/05/2013 07:04   Impression: 1. Acute Ventilatory dependent respiratory failure 2. Pulmonary edema 3. CAD and S/P CABG 4. Acute systolic CHF 5. COPD 6. DM type II 7. Hypertension 8. Hyperlipidemia 9. GERD Active Problems:   CHF (congestive heart failure)     Plan: Medications reviewed Continue respiratory failure Pulmonary consult appreciated Cardiology consult  Allyson Tineo   04/05/2013, 7:53 PM

## 2013-04-05 NOTE — Progress Notes (Signed)
UR chart review completed.  

## 2013-04-05 NOTE — ED Provider Notes (Signed)
CSN: [id number]     Arrival date & time 04/05/13  [id number] History   First MD Initiated Contact with Patient 04/05/13 [phone]     Chief Complaint  Patient presents with  . Respiratory Distress   (Consider location/radiation/quality/duration/timing/severity/associated sxs/prior Treatment) The history is provided by the EMS personnel. The history is limited by the condition of the patient (Respiratory distress).   68 year old male is brought in by EMS in respiratory distress. He was found in his apartment with a cursing heater going. EMS noted the wheezing and treated him with albuterol nebulizer, DuoNeb nebulizer, and methylprednisolone. He stated that his breathing seemed to improve slightly with this. Patient is not able to give any history.  Past Medical History  Diagnosis Date  . Arteriosclerotic cardiovascular disease (ASCVD) 2005    Presented with cardiac arrest and was found to have inferobasilar scarring without critical coronary disease; catheterization in 2008 revealed three-vessel disease prompting CABG surgery  . Hypertension   . Hyperlipidemia   . GERD (gastroesophageal reflux disease)    Past Surgical History  Procedure Laterality Date  . Coronary artery bypass graft  2008  . Cholecystectomy    . Soft tissue cyst excision      Forehead  . Esophagogastroduodenoscopy  11/14/2011    Procedure: ESOPHAGOGASTRODUODENOSCOPY (EGD);  Surgeon: Danie Binder, MD;  Location: [facility];  Service: Endoscopy;  Laterality: N/A;  . Colonoscopy  11/14/2011    Procedure: COLONOSCOPY;  Surgeon: Danie Binder, MD;  Location: [facility];  Service: Endoscopy;  Laterality: N/A;   No family history on file. History  Substance Use Topics  . Smoking status: Current Every Day Smoker  . Smokeless tobacco: Not on file  . Alcohol Use: Yes     Comment: Occ    Review of Systems  Unable to perform ROS: Severe respiratory distress    Allergies  Review of patient's allergies indicates no known  allergies.  Home Medications   Current Outpatient Rx  Name  Route  Sig  Dispense  Refill  . EXPIRED: carvedilol (COREG) 6.25 MG tablet   Oral   Take 1 tablet (6.25 mg total) by mouth 2 (two) times daily with a meal.   60 tablet   3   . EXPIRED: ferrous sulfate 325 (65 FE) MG tablet   Oral   Take 1 tablet (325 mg total) by mouth daily with breakfast.   30 tablet   11   . furosemide (LASIX) 40 MG tablet   Oral   Take 0.5 tablets (20 mg total) by mouth daily.   30 tablet   3   . EXPIRED: pantoprazole (PROTONIX) 40 MG tablet   Oral   Take 1 tablet (40 mg total) by mouth 1 day or 1 dose.   30 tablet   3   . potassium chloride SA (K-DUR,KLOR-CON) 20 MEQ tablet   Oral   Take 1 tablet (20 mEq total) by mouth daily.   30 tablet   3    BP 140/105  Pulse 129  Resp 40  SpO2 82% Physical Exam  Nursing note and vitals reviewed.  68 year old male, in moderate to severe respiratory distress. Vital signs are significant for tachypnea with respiratory rate of 40, tachycardia with heart rate of 129, hypertension with blood pressure 140/105. Oxygen saturation is 82%, which is hypoxic. Head is normocephalic and atraumatic. PERRLA, EOMI. Oropharynx is clear. Neck is nontender and supple without adenopathy. JVD is present at 90. Back is nontender and  there is no CVA tenderness. Lungs have basilar rales which go halfway up, and coarse expiratory rhonchi which seem more consistent with cardiac asthma than actual bronchospasm. Chest is nontender. Heart is tachycardic without murmur. Abdomen is soft, flat, nontender without masses or hepatosplenomegaly and peristalsis is normoactive. Extremities have no cyanosis or edema, full range of motion is present. Skin is warm and dry without rash. Neurologic: He is awake and will answer some questions with yes or no but is unable to speak, cranial nerves are intact, there are no gross motor or sensory deficits.  ED Course  Procedures (including  critical care time) INTUBATION Performed by: [facility]  Required items: required blood products, implants, devices, and special equipment available Patient identity confirmed: provided demographic data and hospital-assigned identification number Time out: Immediately prior to procedure a "time out" was called to verify the correct patient, procedure, equipment, support staff and site/side marked as required.  Indications: Hypercarbic respiratory failure   Intubation method: MacIntosh blade a direct laryngoscopy followed by Glidescope Laryngoscopy   Vocal cords were not able to be visualized using direct laryngoscopy, so kaleidoscope laryngoscopy was used with successful intubation.   Preoxygenation: BVM  Sedatives: Etomidate Paralytic: Succinylcholine  Tube Size: 7.5 cuffed  Post-procedure assessment: chest rise and ETCO2 monitor Breath sounds: equal and absent over the epigastrium Tube secured with: ETT holder Chest x-ray interpreted by radiologist and me.  Chest x-ray findings: endotracheal tube in appropriate position  Patient tolerated the procedure well with no immediate complications.    Labs Review Results for orders placed during the hospital encounter of 04/05/13  CBC WITH DIFFERENTIAL      Result Value Range   WBC 17.5 (*) 4.0 - 10.5 K/uL   RBC 4.29  4.22 - 5.81 MIL/uL   Hemoglobin 8.9 (*) 13.0 - 17.0 g/dL   HCT 31.3 (*) 39.0 - 52.0 %   MCV 73.0 (*) 78.0 - 100.0 fL   MCH 20.7 (*) 26.0 - 34.0 pg   MCHC 28.4 (*) 30.0 - 36.0 g/dL   RDW 18.2 (*) 11.5 - 15.5 %   Platelets 86 (*) 150 - 400 K/uL   Neutrophils Relative % 55  43 - 77 %   Lymphocytes Relative 36  12 - 46 %   Monocytes Relative 7  3 - 12 %   Eosinophils Relative 1  0 - 5 %   Basophils Relative 1  0 - 1 %   Neutro Abs 9.6 (*) 1.7 - 7.7 K/uL   Lymphs Abs 6.3 (*) 0.7 - 4.0 K/uL   Monocytes Absolute 1.2 (*) 0.1 - 1.0 K/uL   Eosinophils Absolute 0.2  0.0 - 0.7 K/uL   Basophils Absolute 0.2 (*) 0.0 -  0.1 K/uL   RBC Morphology SCHISTOCYTES PRESENT (2-5/hpf)     WBC Morphology MILD LEFT SHIFT (1-5% METAS, OCC MYELO, OCC BANDS)    BASIC METABOLIC PANEL      Result Value Range   Sodium 138  137 - 147 mEq/L   Potassium 3.4 (*) 3.7 - 5.3 mEq/L   Chloride 102  96 - 112 mEq/L   CO2 19  19 - 32 mEq/L   Glucose, Bld 296 (*) 70 - 99 mg/dL   BUN 23  6 - 23 mg/dL   Creatinine, Ser 1.85 (*) 0.50 - 1.35 mg/dL   Calcium 8.8  8.4 - 10.5 mg/dL   GFR calc non Af Amer 36 (*) >90 mL/min   GFR calc Af Amer 41 (*) >90 mL/min  PRO B NATRIURETIC PEPTIDE      Result Value Range   Pro B Natriuretic peptide (BNP) 2769.0 (*) 0 - 125 pg/mL  TROPONIN I      Result Value Range   Troponin I <0.30  <0.30 ng/mL  BLOOD GAS, ARTERIAL      Result Value Range   O2 Content 8.0     Delivery systems OXYGEN MASK     pH, Arterial 6.962 (*) 7.350 - 7.450   pCO2 arterial 76.0 (*) 35.0 - 45.0 mmHg   pO2, Arterial 81.3  80.0 - 100.0 mmHg   Bicarbonate 16.3 (*) 20.0 - 24.0 mEq/L   TCO2 17.3  0 - 100 mmol/L   Acid-base deficit 13.8 (*) 0.0 - 2.0 mmol/L   O2 Saturation 84.6     Patient temperature 37.0     Collection site LEFT RADIAL     Drawn by [id number]     Sample type ARTERIAL     Allens test (pass/fail) PASS  PASS  CARBOXYHEMOGLOBIN      Result Value Range   Total hemoglobin 9.0 (*) 13.5 - 18.0 g/dL   O2 Saturation 99.4     Carboxyhemoglobin 2.9 (*) 0.5 - 1.5 %   Methemoglobin 1.2  0.0 - 1.5 %   Total oxygen content 13.1 (*) 15.0 - 23.0 mL/dL  URINALYSIS, ROUTINE W REFLEX MICROSCOPIC      Result Value Range   Color, Urine YELLOW  YELLOW   APPearance CLEAR  CLEAR   Specific Gravity, Urine 1.025  1.005 - 1.030   pH 6.0  5.0 - 8.0   Glucose, UA 100 (*) NEGATIVE mg/dL   Hgb urine dipstick SMALL (*) NEGATIVE   Bilirubin Urine NEGATIVE  NEGATIVE   Ketones, ur NEGATIVE  NEGATIVE mg/dL   Protein, ur 100 (*) NEGATIVE mg/dL   Urobilinogen, UA 0.2  0.0 - 1.0 mg/dL   Nitrite NEGATIVE  NEGATIVE   Leukocytes, UA  NEGATIVE  NEGATIVE  URINE MICROSCOPIC-ADD ON      Result Value Range   WBC, UA 0-2  <3 WBC/hpf   RBC / HPF TOO NUMEROUS TO COUNT  <3 RBC/hpf   Bacteria, UA MANY (*) RARE     Imaging Review Dg Chest Port 1 View  04/05/2013   CLINICAL DATA:  Intubation  EXAM: PORTABLE CHEST - 1 VIEW  COMPARISON:  04/05/2013  FINDINGS: Endotracheal tube in satisfactory position. NG tube is in place with the tip not visualized.  Severe bilateral airspace disease is unchanged and most consistent with edema.  IMPRESSION: Endotracheal tube in satisfactory position  No change in severe bilateral edema.   Electronically Signed   By: Franchot Gallo M.D.   On: 04/05/2013 07:25   Dg Chest Port 1 View  04/05/2013   CLINICAL DATA:  Respiratory distress.  History of CABG  EXAM: PORTABLE CHEST - 1 VIEW  COMPARISON:  11/13/2011  FINDINGS: Interstitial and alveolar edema. Small bilateral pleural effusions. Background COPD with chronic lung hyperinflation. Cardiomegaly, status post CABG.  IMPRESSION: CHF.   Electronically Signed   By: Jorje Guild M.D.   On: 04/05/2013 07:04   Images viewed by me.   Date: 04/05/2013  Rate: 131  Rhythm: sinus tachycardia, premature atrial contractions (PAC) and premature ventricular contractions (PVC)  QRS Axis: normal  Intervals: normal  ST/T Wave abnormalities: ST depressions laterally and T wave inversion laterally  Conduction Disutrbances:none  Narrative Interpretation: Sinus tachycardia with PACs and PVCs, left ventricular hypertrophy with secondary repolarization changes. When compared  with ECG of 11/10/2011, heart rate has increased and ST and T changes are now present.  Old EKG Reviewed: changes noted  CRITICAL CARE Performed by: MOQHU,TMLYY Total critical care time: 75 minutes Critical care time was exclusive of separately billable procedures and treating other patients. Critical care was necessary to treat or prevent imminent or life-threatening deterioration. Critical care  was time spent personally by me on the following activities: development of treatment plan with patient and/or surrogate as well as nursing, discussions with consultants, evaluation of patient's response to treatment, examination of patient, obtaining history from patient or surrogate, ordering and performing treatments and interventions, ordering and review of laboratory studies, ordering and review of radiographic studies, pulse oximetry and re-evaluation of patient's condition.  MDM   1. Pulmonary edema   2. Acute respiratory failure   3. Anemia   4. Renal insufficiency   5. Thrombocytopenia   6. Hypokalemia   7. Urinary tract infection    Acute respiratory distress which I am concerned is more likely CHF plan COPD and bronchospasm. Old records are reviewed and he is managed in the cardiology clinic for diastolic heart failure. He was initially placed on a nonrebreather mask and oxygen saturations increased to 89-90%. He was placed on BiPAP with oxygen saturations increasing to 100%. However, ABG obtained showed severe hypercarbia and respiratory acidosis, so he was intubated. Chest x-ray shows fluffy bilateral airspace disease consistent with pulmonary edema. Because of the space heater, carboxyhemoglobin levels being checked to make sure he is not also suffering from carbon monoxide poisoning.  Postintubation chest x-ray shows good position of ET tube. Carboxyhemoglobin level is slightly elevated but not a toxic level. He has stable anemia and thrombocytopenia. There's been a slight worsening of his chronic renal insufficiency. He has been given a dose of furosemide. Incidental finding of UTI is noted upon placing Foley catheter so he is started on ceftriaxone. Case will be discussed with his PCP to arrange hospital admission.  Delora Fuel, MD 50/35/46 [id number]

## 2013-04-05 NOTE — Progress Notes (Signed)
INITIAL NUTRITION ASSESSMENT  DOCUMENTATION CODES Per approved criteria  -Not Applicable   INTERVENTION: Recommend start Vital AF @ 5 ml/hr. This will provide 144 kcals, 9 grams of protein, and 97 ml fluid daily.  With propofol at current rate, this will provide 1464 kcals and 9 grams of protein (97% of estimated calorie needs and 11% of estimated protein needs). Continue to adjust TF rate with propofol.   NUTRITION DIAGNOSIS: Inadequate oral intake related to inability to eat as evidenced by NPO, mechanical ventilation.   Goal: Initiate nutrition support within 24-48 hours of intubation, if unable to wean pt  Monitor:  Nutrition support measures, propofol rate, labs, weight changes, skin assessments, respiratory status  Reason for Assessment: Mechanical ventilation  68 y.o. male  Admitting Dx: <principal problem not specified>  ASSESSMENT: Pt admitted with respiratory failure. Per MD notes may be related to CHF. Pt being diuresed.  Patient is currently intubated on ventilator support.  MV: 7.42min Temp (24hrs), Avg:97.1 F (36.2 C), Min:96 F (35.6 C), Max:97.9 F (36.6 C)  Propofol: 50 mll/hr (1320 kcals)  Height: Ht Readings from Last 1 Encounters:  04/05/13 5\' 10"  (1.778 m)    Weight: Wt Readings from Last 1 Encounters:  04/05/13 144 lb 2.9 oz (65.4 kg)    Ideal Body Weight: 166#  % Ideal Body Weight: 87%  Wt Readings from Last 10 Encounters:  04/05/13 144 lb 2.9 oz (65.4 kg)  12/01/11 148 lb (67.132 kg)  11/15/11 146 lb 3.2 oz (66.316 kg)  11/15/11 146 lb 3.2 oz (66.316 kg)    Usual Body Weight: 146#  % Usual Body Weight: 99%  BMI:  Body mass index is 20.69 kg/(m^2). Meets criteria for normal weight.   Estimated Nutritional Needs: Kcal: 1510 daily Protein: 82-98 grams daily Fluid: 1.5-1.6 L daily  Skin: Intact, dry, ashen  Diet Order: NPO  EDUCATION NEEDS: -Education not appropriate at this time  No intake or output data in the 24 hours  ending 04/05/13 1522  Last BM: PTA  Labs:   Recent Labs Lab 04/05/13 0638  NA 138  K 3.4*  CL 102  CO2 19  BUN 23  CREATININE 1.85*  CALCIUM 8.8  GLUCOSE 296*    CBG (last 3)  No results found for this basename: GLUCAP,  in the last 72 hours  Scheduled Meds: . antiseptic oral rinse  15 mL Mouth Rinse QID  . cefTRIAXone (ROCEPHIN) IVPB 1 gram/50 mL D5W  1 g Intravenous Q24H  . chlorhexidine  15 mL Mouth Rinse BID  . etomidate      . furosemide  40 mg Intravenous Q8H  . [START ON 04/06/2013] influenza vac split quadrivalent PF  0.5 mL Intramuscular Tomorrow-1000  . ipratropium-albuterol  3 mL Nebulization Q4H  . lidocaine (cardiac) 100 mg/59ml      . lidocaine (cardiac) 100 mg/47ml      . pantoprazole (PROTONIX) IV  40 mg Intravenous Daily  . [START ON 04/06/2013] pneumococcal 23 valent vaccine  0.5 mL Intramuscular Tomorrow-1000  . rocuronium      . rocuronium      . succinylcholine        Continuous Infusions: . propofol 1,000 mg (04/05/13 1311)    Past Medical History  Diagnosis Date  . Arteriosclerotic cardiovascular disease (ASCVD) 2005    Presented with cardiac arrest and was found to have inferobasilar scarring without critical coronary disease; catheterization in 2008 revealed three-vessel disease prompting CABG surgery  . Hypertension   . Hyperlipidemia   .  GERD (gastroesophageal reflux disease)     Past Surgical History  Procedure Laterality Date  . Coronary artery bypass graft  2008  . Cholecystectomy    . Soft tissue cyst excision      Forehead  . Esophagogastroduodenoscopy  11/14/2011    Procedure: ESOPHAGOGASTRODUODENOSCOPY (EGD);  Surgeon: Danie Binder, MD;  Location: [facility];  Service: Endoscopy;  Laterality: N/A;  . Colonoscopy  11/14/2011    Procedure: COLONOSCOPY;  Surgeon: Danie Binder, MD;  Location: [facility];  Service: Endoscopy;  Laterality: N/A;    Harper Vandervoort A. Jimmye Norman, RD, LDN Pager: [phone]

## 2013-04-05 NOTE — Progress Notes (Signed)
Inpatient Diabetes Program Recommendations  AACE/ADA: New Consensus Statement on Inpatient Glycemic Control (2013)  Target Ranges:  Prepandial:   less than 140 mg/dL      Peak postprandial:   less than 180 mg/dL (1-2 hours)      Critically ill patients:  140 - 180 mg/dL   Results for [name], [name] (MRN [id number]) as of 04/05/2013 09:40  Ref. Range 04/05/2013 06:38  Glucose Latest Range: 70-99 mg/dL 296 (H)    Inpatient Diabetes Program Recommendations Correction (SSI): Noted initial lab glucose of 296 mg/dl.  Also noted patient received steroids by EMS prior to transport to hospital.  May want to consider ordering CBGs Q4H and ordering Novolog correction Q4H if needed. A1C: Please consider ordering an A1C to evaluate glycemic control over the past 2-3 months.    Note: Patient does not have a documented history of diabetes.  Noted initial lab glucose was 296 m/gld on 04/05/13.  May want to order CBGs Q4H and order Novolog correction Q4H if needed.   Thanks, Barnie Alderman, RN, MSN, CCRN Diabetes Coordinator Inpatient Diabetes Program [phone] (Team Pager) [phone] ([facility] office) [phone] [facility] office)

## 2013-04-05 NOTE — Progress Notes (Signed)
ANTICOAGULATION CONSULT NOTE - Initial Consult  Pharmacy Consult for Lovenox Indication: VTE prophylaxis  No Known Allergies  Patient Measurements: Height: 5\' 10"  (177.8 cm) Weight: 144 lb 2.9 oz (65.4 kg) IBW/kg (Calculated) : 73 Heparin Dosing Weight:   Vital Signs: Temp: 96 F (35.6 C) (01/08 1301) Temp src: Core (Comment) (01/08 1301) BP: 89/57 mmHg (01/08 1400) Pulse Rate: 76 (01/08 1400)  Labs:  Recent Labs  04/05/13 0638  HGB 8.9*  HCT 31.3*  PLT 86*  CREATININE 1.85*  TROPONINI <0.30    Estimated Creatinine Clearance: 35.4 ml/min (by C-G formula based on Cr of 1.85).   Medical History: Past Medical History  Diagnosis Date  . Arteriosclerotic cardiovascular disease (ASCVD) 2005    Presented with cardiac arrest and was found to have inferobasilar scarring without critical coronary disease; catheterization in 2008 revealed three-vessel disease prompting CABG surgery  . Hypertension   . Hyperlipidemia   . GERD (gastroesophageal reflux disease)     Medications:  Scheduled:  . antiseptic oral rinse  15 mL Mouth Rinse QID  . cefTRIAXone (ROCEPHIN) IVPB 1 gram/50 mL D5W  1 g Intravenous Q24H  . chlorhexidine  15 mL Mouth Rinse BID  . enoxaparin (LOVENOX) injection  40 mg Subcutaneous Q24H  . etomidate      . furosemide  40 mg Intravenous Q8H  . [START ON 04/06/2013] influenza vac split quadrivalent PF  0.5 mL Intramuscular Tomorrow-1000  . ipratropium-albuterol  3 mL Nebulization Q4H  . lidocaine (cardiac) 100 mg/56ml      . lidocaine (cardiac) 100 mg/13ml      . pantoprazole (PROTONIX) IV  40 mg Intravenous Daily  . [START ON 04/06/2013] pneumococcal 23 valent vaccine  0.5 mL Intramuscular Tomorrow-1000  . rocuronium      . rocuronium      . succinylcholine        Assessment: Admitted respiratory failure CHF CrCl > 30 ml/min Patient weight 65.4 kg  Goal of Therapy:  DVT prophylaxis dosing Monitor platelets by anticoagulation protocol: Yes   Plan:   Lovenox 40 mg SQ every 24 hours Monitor renal function Labs per protocol  Abner Greenspan, Zakk Borgen Bennett 04/05/2013,4:46 PM

## 2013-04-05 NOTE — ED Notes (Addendum)
Intubation per Dr Roxanne Mins   7.5 ETT  24 at lip  Positive breath sounds.  Post CXR ordered

## 2013-04-06 ENCOUNTER — Encounter (HOSPITAL_COMMUNITY)
Admission: EM | Disposition: A | Discharge status: Home Health | Payer: Self-pay | Source: Home / Self Care | Attending: Internal Medicine

## 2013-04-06 ENCOUNTER — Inpatient Hospital Stay (HOSPITAL_COMMUNITY): Payer: Medicare Other

## 2013-04-06 DIAGNOSIS — K2991 Gastroduodenitis, unspecified, with bleeding: Secondary | ICD-10-CM

## 2013-04-06 DIAGNOSIS — K922 Gastrointestinal hemorrhage, unspecified: Secondary | ICD-10-CM

## 2013-04-06 DIAGNOSIS — I5031 Acute diastolic (congestive) heart failure: Secondary | ICD-10-CM

## 2013-04-06 DIAGNOSIS — D131 Benign neoplasm of stomach: Secondary | ICD-10-CM

## 2013-04-06 DIAGNOSIS — K2971 Gastritis, unspecified, with bleeding: Secondary | ICD-10-CM

## 2013-04-06 DIAGNOSIS — J811 Chronic pulmonary edema: Secondary | ICD-10-CM

## 2013-04-06 DIAGNOSIS — I059 Rheumatic mitral valve disease, unspecified: Secondary | ICD-10-CM

## 2013-04-06 DIAGNOSIS — I2510 Atherosclerotic heart disease of native coronary artery without angina pectoris: Secondary | ICD-10-CM

## 2013-04-06 DIAGNOSIS — R7989 Other specified abnormal findings of blood chemistry: Secondary | ICD-10-CM

## 2013-04-06 DIAGNOSIS — I509 Heart failure, unspecified: Secondary | ICD-10-CM

## 2013-04-06 DIAGNOSIS — R778 Other specified abnormalities of plasma proteins: Secondary | ICD-10-CM

## 2013-04-06 HISTORY — PX: ESOPHAGOGASTRODUODENOSCOPY: SHX5428

## 2013-04-06 LAB — CBC
HEMATOCRIT: 25.5 % — AB (ref 39.0–52.0)
Hemoglobin: 7.5 g/dL — ABNORMAL LOW (ref 13.0–17.0)
MCH: 20.9 pg — ABNORMAL LOW (ref 26.0–34.0)
MCHC: 29.4 g/dL — ABNORMAL LOW (ref 30.0–36.0)
MCV: 71 fL — ABNORMAL LOW (ref 78.0–100.0)
Platelets: 104 10*3/uL — ABNORMAL LOW (ref 150–400)
RBC: 3.59 MIL/uL — ABNORMAL LOW (ref 4.22–5.81)
RDW: 18 % — ABNORMAL HIGH (ref 11.5–15.5)
WBC: 12 10*3/uL — AB (ref 4.0–10.5)

## 2013-04-06 LAB — GLUCOSE, CAPILLARY
GLUCOSE-CAPILLARY: 102 mg/dL — AB (ref 70–99)
GLUCOSE-CAPILLARY: 104 mg/dL — AB (ref 70–99)
Glucose-Capillary: 104 mg/dL — ABNORMAL HIGH (ref 70–99)
Glucose-Capillary: 118 mg/dL — ABNORMAL HIGH (ref 70–99)
Glucose-Capillary: 79 mg/dL (ref 70–99)
Glucose-Capillary: 92 mg/dL (ref 70–99)
Glucose-Capillary: 96 mg/dL (ref 70–99)

## 2013-04-06 LAB — URINE CULTURE
Colony Count: NO GROWTH
Culture: NO GROWTH

## 2013-04-06 LAB — VITAMIN B12: Vitamin B-12: 319 pg/mL (ref 211–911)

## 2013-04-06 LAB — OCCULT BLOOD GASTRIC / DUODENUM (SPECIMEN CUP)
Occult Blood, Gastric: POSITIVE — AB
PH, GASTRIC: 2

## 2013-04-06 LAB — BASIC METABOLIC PANEL
BUN: 34 mg/dL — AB (ref 6–23)
CHLORIDE: 103 meq/L (ref 96–112)
CO2: 20 mEq/L (ref 19–32)
Calcium: 8.4 mg/dL (ref 8.4–10.5)
Creatinine, Ser: 2.68 mg/dL — ABNORMAL HIGH (ref 0.50–1.35)
GFR calc Af Amer: 26 mL/min — ABNORMAL LOW (ref >90)
GFR calc non Af Amer: 23 mL/min — ABNORMAL LOW (ref >90)
Glucose, Bld: 119 mg/dL — ABNORMAL HIGH (ref 70–99)
POTASSIUM: 4.9 meq/L (ref 3.7–5.3)
Sodium: 139 mEq/L (ref 137–147)

## 2013-04-06 LAB — BLOOD GAS, ARTERIAL
Acid-base deficit: 5.4 mmol/L — ABNORMAL HIGH (ref 0.0–2.0)
BICARBONATE: 19.7 meq/L — AB (ref 20.0–24.0)
Drawn by: 213101
FIO2: 45 %
O2 SAT: 98.7 %
PATIENT TEMPERATURE: 37
PEEP: 5 cmH2O
RATE: 15 resp/min
TCO2: 18.2 mmol/L (ref 0–100)
VT: 500 mL
pCO2 arterial: 39.9 mmHg (ref 35.0–45.0)
pH, Arterial: 7.314 — ABNORMAL LOW (ref 7.350–7.450)
pO2, Arterial: 135 mmHg — ABNORMAL HIGH (ref 80.0–100.0)

## 2013-04-06 LAB — PROTIME-INR
INR: 1.09 (ref 0.00–1.49)
PROTHROMBIN TIME: 13.9 s (ref 11.6–15.2)

## 2013-04-06 LAB — IRON AND TIBC
IRON: 19 ug/dL — AB (ref 42–135)
SATURATION RATIOS: 6 % — AB (ref 20–55)
TIBC: 340 ug/dL (ref 215–435)
UIBC: 321 ug/dL (ref 125–400)

## 2013-04-06 LAB — TROPONIN I
TROPONIN I: 0.35 ng/mL — AB (ref <0.30)
Troponin I: 0.38 ng/mL (ref <0.30)
Troponin I: <0.3 ng/mL (ref <0.30)

## 2013-04-06 LAB — PROTEIN / CREATININE RATIO, URINE
CREATININE, URINE: 67.01 mg/dL
Protein Creatinine Ratio: 0.07 (ref 0.00–0.15)
Total Protein, Urine: 5 mg/dL

## 2013-04-06 LAB — FERRITIN: Ferritin: 33 ng/mL (ref 22–322)

## 2013-04-06 LAB — TRIGLYCERIDES: Triglycerides: 137 mg/dL (ref <150)

## 2013-04-06 LAB — FOLATE: Folate: 7.9 ng/mL

## 2013-04-06 LAB — PREPARE RBC (CROSSMATCH)

## 2013-04-06 SURGERY — EGD (ESOPHAGOGASTRODUODENOSCOPY)
Anesthesia: Moderate Sedation

## 2013-04-06 MED ORDER — DIPHENHYDRAMINE HCL 50 MG/ML IJ SOLN
25.0000 mg | Freq: Once | INTRAMUSCULAR | Status: AC
  Administered 2013-04-06: 25 mg via INTRAVENOUS
  Filled 2013-04-06: qty 1

## 2013-04-06 MED ORDER — METOPROLOL TARTRATE 1 MG/ML IV SOLN
5.0000 mg | Freq: Four times a day (QID) | INTRAVENOUS | Status: DC
  Administered 2013-04-06 – 2013-04-08 (×6): 5 mg via INTRAVENOUS
  Filled 2013-04-06 (×6): qty 5

## 2013-04-06 MED ORDER — METOCLOPRAMIDE HCL 5 MG/ML IJ SOLN
10.0000 mg | Freq: Once | INTRAMUSCULAR | Status: AC
  Administered 2013-04-06: 10 mg via INTRAVENOUS

## 2013-04-06 MED ORDER — FENTANYL CITRATE 0.05 MG/ML IJ SOLN
50.0000 ug | INTRAMUSCULAR | Status: DC | PRN
  Administered 2013-04-06: 50 ug via INTRAVENOUS
  Filled 2013-04-06 (×2): qty 2

## 2013-04-06 MED ORDER — MIDAZOLAM HCL 5 MG/5ML IJ SOLN
INTRAMUSCULAR | Status: DC | PRN
  Administered 2013-04-06: 2 mg via INTRAVENOUS

## 2013-04-06 MED ORDER — FENTANYL CITRATE 0.05 MG/ML IJ SOLN: 50.0000 ug | INTRAMUSCULAR | Status: DC | PRN

## 2013-04-06 MED ORDER — BUTAMBEN-TETRACAINE-BENZOCAINE 2-2-14 % EX AERO
INHALATION_SPRAY | CUTANEOUS | Status: DC | PRN
  Administered 2013-04-06: 1 via TOPICAL

## 2013-04-06 MED ORDER — JEVITY 1.2 CAL PO LIQD
1000.0000 mL | ORAL | Status: DC
  Administered 2013-04-06: 1000 mL
  Filled 2013-04-06 (×4): qty 1000

## 2013-04-06 MED ORDER — PANTOPRAZOLE SODIUM 40 MG IV SOLR
40.0000 mg | Freq: Two times a day (BID) | INTRAVENOUS | Status: DC
  Administered 2013-04-06 – 2013-04-07 (×3): 40 mg via INTRAVENOUS
  Filled 2013-04-06 (×3): qty 40

## 2013-04-06 MED ORDER — FENTANYL CITRATE 0.05 MG/ML IJ SOLN
INTRAMUSCULAR | Status: DC | PRN
  Administered 2013-04-06: 50 ug via INTRAVENOUS

## 2013-04-06 MED ORDER — ENOXAPARIN SODIUM 30 MG/0.3ML ~~LOC~~ SOLN
30.0000 mg | SUBCUTANEOUS | Status: DC
Start: 2013-04-06 — End: 2013-04-06

## 2013-04-06 MED ORDER — PROPOFOL 10 MG/ML IV EMUL
5.0000 ug/kg/min | INTRAVENOUS | Status: DC
  Administered 2013-04-06 (×2): 35 ug/kg/min via INTRAVENOUS
  Administered 2013-04-07: 10 ug/kg/min via INTRAVENOUS
  Filled 2013-04-06: qty 200
  Filled 2013-04-06: qty 100

## 2013-04-06 MED ORDER — MIDAZOLAM HCL 2 MG/2ML IJ SOLN
INTRAMUSCULAR | Status: AC
  Filled 2013-04-06: qty 2

## 2013-04-06 MED ORDER — STERILE WATER FOR IRRIGATION IR SOLN
Status: DC | PRN
  Administered 2013-04-06: 12:00:00

## 2013-04-06 MED ORDER — METOCLOPRAMIDE HCL 5 MG/ML IJ SOLN
INTRAMUSCULAR | Status: AC
  Filled 2013-04-06: qty 2

## 2013-04-06 NOTE — Progress Notes (Signed)
CRITICAL VALUE ALERT  Critical value received:  Troponin 0.38  Date of notification:  04/06/13  Time of notification:  0940  Critical value read back:yes  Nurse who received alert:  N.Rodolfo Notaro,RN  MD notified (1st page):  [place]  Time of first page:  [phone]  MD notified (2nd page):  Time of second page:  Responding MD:  Legrand Rams. Orders given to cycle troponins x2 q6h. Obtain another EKG. And notify cardiology of labs  Time MD responded:  0945
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
## 2013-04-06 NOTE — Progress Notes (Signed)
Echo reviewed, overall fairly stable from prior echo 11/2011. He has fairly significant functional mitral regurgitation that appears moderate to severe, similar to prior echo upon my review. His LVEF is mildly reduced, however in the setting of significant MR, his function is likely lower than 40-45%. He has severe diastolic dysfunction as well. Continue diuresis and management of GI bleed/anemia, once more stable can readdress long term management of his heart failure and mitral regurgitation.   Carlyle Dolly MD

## 2013-04-06 NOTE — Progress Notes (Signed)
Radiology called to inform me that OG tube was not in the correct position. Advanced OG ~8cm per radiologist and immediately began getting coffee ground-like contents. Dr. Luan Pulling at bedside noted color and orders given.
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
## 2013-04-06 NOTE — Progress Notes (Signed)
Subjective: Patient was admitted yesterday due to respiratory failure. He his on vent and sedated.  Objective: Vital signs in last 24 hours: Temp:  [96 F (35.6 C)-99.4 F (37.4 C)] 98.8 F (37.1 C) (01/09 0630) Pulse Rate:  [43-104] 87 (01/09 0630) Resp:  [15-31] 19 (01/09 0630) BP: (73-145)/(34-79) 100/68 mmHg (01/09 0630) SpO2:  [97 %-100 %] 100 % (01/09 0630) FiO2 (%):  [45 %] 45 % (01/09 0351) Weight:  [65.4 kg (144 lb 2.9 oz)] 65.4 kg (144 lb 2.9 oz) (01/08 1059) Weight change:  Last BM Date:  (unknown)  Intake/Output from previous day: 01/08 0701 - 01/09 0700 In: -  Out: 2400 [Urine:2300; Emesis/NG output:100]  PHYSICAL EXAM General appearance: sedated and on vent Resp: diminished breath sounds bilaterally and rhonchi bilaterally Cardio: S1, S2 normal GI: soft, non-tender; bowel sounds normal; no masses,  no organomegaly Extremities: extremities normal, atraumatic, no cyanosis or edema  Lab Results:    @labtest @ [facility]  Recent Labs  04/06/13 0450  PHART 7.314*  PO2ART 135.0*  TCO2 18.2  HCO3 19.7*   CULTURES Recent Results (from the past 240 hour(s))  MRSA PCR SCREENING     Status: None   Collection Time    04/05/13 12:30 PM      Result Value Range Status   MRSA by PCR NEGATIVE  NEGATIVE Final   Comment:            The GeneXpert MRSA Assay (FDA     approved for NASAL specimens     only), is one component of a     comprehensive MRSA colonization     surveillance program. It is not     intended to diagnose MRSA     infection nor to guide or     monitor treatment for     MRSA infections.   Studies/Results: Portable Chest Xray In Am  04/06/2013   CLINICAL DATA:  Respiratory failure.  Hypertension.  Hyperlipidemia.  EXAM: PORTABLE CHEST - 1 VIEW  COMPARISON:  DG CHEST 1V PORT dated 04/05/2013; DG CHEST 1V PORT dated 04/05/2013  FINDINGS: Endotracheal tube 4.1 cm above carina. Nasogastric tube terminates at the lower thoracic esophagus.  Cardiomegaly  accentuated by [facility] portable technique. Atherosclerosis in the transverse aorta. Small bilateral pleural effusions. Right apex excluded. No pneumothorax. Improvement in right greater than left interstitial and lower lobe predominant airspace disease. Suspect underlying COPD.  IMPRESSION: Nasogastric tube which terminates at the low thoracic esophagus. This should be advanced. These results will be called to the ordering clinician or representative by the Radiologist Assistant, and communication documented in the PACS Dashboard.  Improved aeration with edema and/or infection persisting. Greater on the right.  Small bilateral pleural effusions.   Electronically Signed   By: Abigail Miyamoto M.D.   On: 04/06/2013 07:42   Dg Chest Port 1 View  04/05/2013   CLINICAL DATA:  Intubation  EXAM: PORTABLE CHEST - 1 VIEW  COMPARISON:  04/05/2013  FINDINGS: Endotracheal tube in satisfactory position. NG tube is in place with the tip not visualized.  Severe bilateral airspace disease is unchanged and most consistent with edema.  IMPRESSION: Endotracheal tube in satisfactory position  No change in severe bilateral edema.   Electronically Signed   By: Franchot Gallo M.D.   On: 04/05/2013 07:25   Dg Chest Port 1 View  04/05/2013   CLINICAL DATA:  Respiratory distress.  History of CABG  EXAM: PORTABLE CHEST - 1 VIEW  COMPARISON:  11/13/2011  FINDINGS: Interstitial  and alveolar edema. Small bilateral pleural effusions. Background COPD with chronic lung hyperinflation. Cardiomegaly, status post CABG.  IMPRESSION: CHF.   Electronically Signed   By: Jorje Guild M.D.   On: 04/05/2013 07:04    Medications: I have reviewed the patient's current medications.  Assesment: Active Problems:   CHF (congestive heart failure) 1. Acute Ventilatory dependent respiratory failure  2. Pulmonary edema  3. CAD and S/P CABG  4. Acute systolic CHF  5. COPD  6. DM type II  7. Hypertension  8. Hyperlipidemia  9. GERD 10. Anemia 11. Acute  on chronic renal failure  Plan: Medications reviewed Continue ventilatory support Cardiology consult Nephrology consult Will continue to monitor cbc/bmp    LOS: 1 day   Kellar Westberg 04/06/2013, 8:02 AM

## 2013-04-06 NOTE — Progress Notes (Addendum)
Subjective: He remains intubated and on a ventilator. He has been on propofol and apparently when the dose was reduced he actually attempted to get out of bed. He is sedated now.  Objective: Vital signs in last 24 hours: Temp:  [96 F (35.6 C)-99.4 F (37.4 C)] 98.8 F (37.1 C) (01/09 0630) Pulse Rate:  [43-104] 87 (01/09 0630) Resp:  [15-31] 19 (01/09 0630) BP: (73-145)/(34-79) 100/68 mmHg (01/09 0630) SpO2:  [97 %-100 %] 100 % (01/09 0630) FiO2 (%):  [45 %] 45 % (01/09 0351) Weight:  [65.4 kg (144 lb 2.9 oz)] 65.4 kg (144 lb 2.9 oz) (01/08 1059) Weight change:  Last BM Date:  (unknown)  Intake/Output from previous day: 01/08 0701 - 01/09 0700 In: -  Out: 2400 [Urine:2300; Emesis/NG output:100]  PHYSICAL EXAM General appearance: Intubated and sedated on ventilator support Resp: rales bilaterally Cardio: regular rate and rhythm, S1, S2 normal, no murmur, click, rub or gallop GI: soft, non-tender; bowel sounds normal; no masses,  no organomegaly Extremities: extremities normal, atraumatic, no cyanosis or edema  Lab Results:    Basic Metabolic Panel:  Recent Labs  04/05/13 0638 04/06/13 0448  NA 138 139  K 3.4* 4.9  CL 102 103  CO2 19 20  GLUCOSE 296* 119*  BUN 23 34*  CREATININE 1.85* 2.68*  CALCIUM 8.8 8.4   Liver Function Tests: No results found for this basename: AST, ALT, ALKPHOS, BILITOT, PROT, ALBUMIN,  in the last 72 hours No results found for this basename: LIPASE, AMYLASE,  in the last 72 hours No results found for this basename: AMMONIA,  in the last 72 hours CBC:  Recent Labs  04/05/13 0638 04/06/13 0448  WBC 17.5* 12.0*  NEUTROABS 9.6*  --   HGB 8.9* 7.5*  HCT 31.3* 25.5*  MCV 73.0* 71.0*  PLT 86* 104*   Cardiac Enzymes:  Recent Labs  04/05/13 0638  TROPONINI <0.30   BNP:  Recent Labs  04/05/13 0638  PROBNP 2769.0*   D-Dimer: No results found for this basename: DDIMER,  in the last 72 hours CBG:  Recent Labs   04/05/13 2009 04/05/13 2338 04/06/13 0416  GLUCAP 124* 118* 102*   Hemoglobin A1C: No results found for this basename: HGBA1C,  in the last 72 hours Fasting Lipid Panel:  Recent Labs  04/06/13 0448  TRIG 137   Thyroid Function Tests: No results found for this basename: TSH, T4TOTAL, FREET4, T3FREE, THYROIDAB,  in the last 72 hours Anemia Panel:  Recent Labs  04/05/13 2030  RETICCTPCT 3.3*   Coagulation: No results found for this basename: LABPROT, INR,  in the last 72 hours Urine Drug Screen: Drugs of Abuse  No results found for this basename: labopia, cocainscrnur, labbenz, amphetmu, thcu, labbarb    Alcohol Level: No results found for this basename: ETH,  in the last 72 hours Urinalysis:  Recent Labs  04/05/13 0727  COLORURINE YELLOW  LABSPEC 1.025  PHURINE 6.0  GLUCOSEU 100*  HGBUR SMALL*  BILIRUBINUR NEGATIVE  KETONESUR NEGATIVE  PROTEINUR 100*  UROBILINOGEN 0.2  NITRITE NEGATIVE  LEUKOCYTESUR NEGATIVE   Misc. Labs:  ABGS  Recent Labs  04/06/13 0450  PHART 7.314*  PO2ART 135.0*  TCO2 18.2  HCO3 19.7*   CULTURES Recent Results (from the past 240 hour(s))  MRSA PCR SCREENING     Status: None   Collection Time    04/05/13 12:30 PM      Result Value Range Status   MRSA by PCR NEGATIVE  NEGATIVE  Final   Comment:            The GeneXpert MRSA Assay (FDA     approved for NASAL specimens     only), is one component of a     comprehensive MRSA colonization     surveillance program. It is not     intended to diagnose MRSA     infection nor to guide or     monitor treatment for     MRSA infections.   Studies/Results: Portable Chest Xray In Am  04/06/2013   CLINICAL DATA:  Respiratory failure.  Hypertension.  Hyperlipidemia.  EXAM: PORTABLE CHEST - 1 VIEW  COMPARISON:  DG CHEST 1V PORT dated 04/05/2013; DG CHEST 1V PORT dated 04/05/2013  FINDINGS: Endotracheal tube 4.1 cm above carina. Nasogastric tube terminates at the lower thoracic esophagus.   Cardiomegaly accentuated by AP portable technique. Atherosclerosis in the transverse aorta. Small bilateral pleural effusions. Right apex excluded. No pneumothorax. Improvement in right greater than left interstitial and lower lobe predominant airspace disease. Suspect underlying COPD.  IMPRESSION: Nasogastric tube which terminates at the low thoracic esophagus. This should be advanced. These results will be called to the ordering clinician or representative by the Radiologist Assistant, and communication documented in the PACS Dashboard.  Improved aeration with edema and/or infection persisting. Greater on the right.  Small bilateral pleural effusions.   Electronically Signed   By: Abigail Miyamoto M.D.   On: 04/06/2013 07:42   Dg Chest Port 1 View  04/05/2013   CLINICAL DATA:  Intubation  EXAM: PORTABLE CHEST - 1 VIEW  COMPARISON:  04/05/2013  FINDINGS: Endotracheal tube in satisfactory position. NG tube is in place with the tip not visualized.  Severe bilateral airspace disease is unchanged and most consistent with edema.  IMPRESSION: Endotracheal tube in satisfactory position  No change in severe bilateral edema.   Electronically Signed   By: Franchot Gallo M.D.   On: 04/05/2013 07:25   Dg Chest Port 1 View  04/05/2013   CLINICAL DATA:  Respiratory distress.  History of CABG  EXAM: PORTABLE CHEST - 1 VIEW  COMPARISON:  11/13/2011  FINDINGS: Interstitial and alveolar edema. Small bilateral pleural effusions. Background COPD with chronic lung hyperinflation. Cardiomegaly, status post CABG.  IMPRESSION: CHF.   Electronically Signed   By: Jorje Guild M.D.   On: 04/05/2013 07:04    Medications:  Scheduled: . antiseptic oral rinse  15 mL Mouth Rinse QID  . cefTRIAXone (ROCEPHIN) IVPB 1 gram/50 mL D5W  1 g Intravenous Q24H  . chlorhexidine  15 mL Mouth Rinse BID  . enoxaparin (LOVENOX) injection  40 mg Subcutaneous Q24H  . furosemide  40 mg Intravenous Q8H  . influenza vac split quadrivalent PF  0.5 mL  Intramuscular Tomorrow-1000  . insulin aspart  0-9 Units Subcutaneous Q4H  . ipratropium-albuterol  3 mL Nebulization Q4H  . pantoprazole (PROTONIX) IV  40 mg Intravenous Daily  . pneumococcal 23 valent vaccine  0.5 mL Intramuscular Tomorrow-1000   Continuous: . propofol     GUY:[id number], fentaNYL  Assesment: He was admitted with congestive heart failure and pulmonary edema. He has a history of coronary artery bypass grafting. He is more anemic now and may require a blood transfusion because of his history of coronary artery occlusive disease. He looks better as far as his pulmonary edema is concerned. Active Problems:   CHF (congestive heart failure)    Plan: Attempt weaning. After I started this note his diprovan was decreased  and he became very agitated. His nasogastric drainage is dark and looks almost like coffee grounds so I'm going to have him go ahead and get gastrocult done. I'm also going to go ahead and set him up for blood transfusion. I've ordered an echocardiogram. I'm also going to put him on our more typical sedation protocol but I'm not sure that will be effective for him    LOS: 1 day   Lathyn Griggs L 04/06/2013, 7:47 AM

## 2013-04-06 NOTE — Care Management Note (Addendum)
    Page 1 of 1   04/09/2013     2:51:49 PM   CARE MANAGEMENT NOTE 04/09/2013  Patient:  Manuel, Lyons   Account Number:  [id number]  Date Initiated:  04/06/2013  Documentation initiated by:  Theophilus Kinds  Subjective/Objective Assessment:   Pt admitted from home with respiratory failure, CHF, and gi bleed. Pt is currently intubated.     Action/Plan:   Will continue to follow for discharge planning needs. Will attempt to talk with family about prior functioning status.   Anticipated DC Date:  04/13/2013   Anticipated DC Plan:  [place]  CM consult      Choice offered to / List presented to:             Status of service:  Completed, signed off Medicare Important Message given?   (If response is "NO", the following Medicare IM given date fields will be blank) Date Medicare IM given:   Date Additional Medicare IM given:    Discharge Disposition:  [place]  Per UR Regulation:    If discussed at Long Length of Stay Meetings, dates discussed:    Comments:  04/09/13 [place], RN BSN CM Pt extubated and able to communicate. Pt stated that he lives alone and has a friend that comes in daily to assist with pts needs. Pt does have a son and daughter in law that is active in the care of the pt. Pt stated that he is independent with ADL's. Pt does not have a Part D policy to help with medications. May need MATCH voucher at discharge to assist with medications. Pt was encouraged to obtain coverage to help with medications. Pt verbalized understanding.  04/06/13 [place], RN BSN CM

## 2013-04-06 NOTE — Consult Note (Addendum)
Reason for Consult: Acute kidney injury superimposed on chronic Referring Physician: Dr. Tito Dine [name] is an 68 y.o. male.  HPI: Is a patient who has history of her a disease status post CABG, history of hypertension, history of CHF presently and was brought after patient was found to be unresponsive and in respiratory failure. Presently patient is intubated and unable to get any additional history. Looking at his chart patient has chronic renal failure stage III. His creatinine was 1.64 with EGFR of 14 cc per minute on 11/15/2011. Presently his BUN and creatinine has increased hence consult is called.   Past Medical History  Diagnosis Date  . Arteriosclerotic cardiovascular disease (ASCVD) 2005    Presented with cardiac arrest and was found to have inferobasilar scarring without critical coronary disease; catheterization in 2008 revealed three-vessel disease prompting CABG surgery  . Hypertension   . Hyperlipidemia   . GERD (gastroesophageal reflux disease)     Past Surgical History  Procedure Laterality Date  . Coronary artery bypass graft  2008  . Cholecystectomy    . Soft tissue cyst excision      Forehead  . Esophagogastroduodenoscopy  11/14/2011    Procedure: ESOPHAGOGASTRODUODENOSCOPY (EGD);  Surgeon: Danie Binder, MD;  Location: [facility];  Service: Endoscopy;  Laterality: N/A;  . Colonoscopy  11/14/2011    Procedure: COLONOSCOPY;  Surgeon: Danie Binder, MD;  Location: [facility];  Service: Endoscopy;  Laterality: N/A;    No family history on file.  Social History:  reports that he has been smoking.  He does not have any smokeless tobacco history on file. He reports that he drinks alcohol. His drug history is not on file.  Allergies: No Known Allergies  Medications: I have reviewed the patient's current medications.  Results for orders placed during the hospital encounter of 04/05/13 (from the past 48 hour(s))  BLOOD GAS, ARTERIAL     Status: Abnormal   Collection Time    04/05/13  6:27 AM      Result Value Range   O2 Content 8.0     Delivery systems OXYGEN MASK     pH, Arterial 6.962 (*) 7.350 - 7.450   Comment: CRITICAL RESULT CALLED TO, READ BACK BY AND VERIFIED WITH:     DR Roxanne Mins AT [id number] BY DEMETRIA SCOTT RRT,RCP ON 04/05/13   pCO2 arterial 76.0 (*) 35.0 - 45.0 mmHg   Comment: CRITICAL RESULT CALLED TO, READ BACK BY AND VERIFIED WITH:     DR Roxanne Mins AT 4401 BY DEMETRIA SCOTT RRT,RCP ON 04/05/13   pO2, Arterial 81.3  80.0 - 100.0 mmHg   Bicarbonate 16.3 (*) 20.0 - 24.0 mEq/L   TCO2 17.3  0 - 100 mmol/L   Acid-base deficit 13.8 (*) 0.0 - 2.0 mmol/L   O2 Saturation 84.6     Patient temperature 37.0     Collection site LEFT RADIAL     Drawn by [id number]     Sample type ARTERIAL     Allens test (pass/fail) PASS  PASS  CBC WITH DIFFERENTIAL     Status: Abnormal   Collection Time    04/05/13  6:38 AM      Result Value Range   WBC 17.5 (*) 4.0 - 10.5 K/uL   RBC 4.29  4.22 - 5.81 MIL/uL   Hemoglobin 8.9 (*) 13.0 - 17.0 g/dL   HCT 31.3 (*) 39.0 - 52.0 %   MCV 73.0 (*) 78.0 - 100.0 fL   MCH 20.7 (*)  26.0 - 34.0 pg   MCHC 28.4 (*) 30.0 - 36.0 g/dL   RDW 18.2 (*) 11.5 - 15.5 %   Platelets 86 (*) 150 - 400 K/uL   Comment: GIANT PLATELETS NOTED     PLATELETS APPEAR DECREASED   Neutrophils Relative % 55  43 - 77 %   Lymphocytes Relative 36  12 - 46 %   Monocytes Relative 7  3 - 12 %   Eosinophils Relative 1  0 - 5 %   Basophils Relative 1  0 - 1 %   Neutro Abs 9.6 (*) 1.7 - 7.7 K/uL   Lymphs Abs 6.3 (*) 0.7 - 4.0 K/uL   Monocytes Absolute 1.2 (*) 0.1 - 1.0 K/uL   Eosinophils Absolute 0.2  0.0 - 0.7 K/uL   Basophils Absolute 0.2 (*) 0.0 - 0.1 K/uL   RBC Morphology SCHISTOCYTES PRESENT (2-5/hpf)     Comment: ELLIPTOCYTES   WBC Morphology MILD LEFT SHIFT (1-5% METAS, OCC MYELO, OCC BANDS)    BASIC METABOLIC PANEL     Status: Abnormal   Collection Time    04/05/13  6:38 AM      Result Value Range   Sodium 138  137 - 147 mEq/L    Potassium 3.4 (*) 3.7 - 5.3 mEq/L   Chloride 102  96 - 112 mEq/L   CO2 19  19 - 32 mEq/L   Glucose, Bld 296 (*) 70 - 99 mg/dL   BUN 23  6 - 23 mg/dL   Creatinine, Ser 1.85 (*) 0.50 - 1.35 mg/dL   Calcium 8.8  8.4 - 10.5 mg/dL   GFR calc non Af Amer 36 (*) >90 mL/min   GFR calc Af Amer 41 (*) >90 mL/min   Comment: (NOTE)     The eGFR has been calculated using the CKD EPI equation.     This calculation has not been validated in all clinical situations.     eGFR's persistently <90 mL/min signify possible Chronic Kidney     Disease.  PRO B NATRIURETIC PEPTIDE     Status: Abnormal   Collection Time    04/05/13  6:38 AM      Result Value Range   Pro B Natriuretic peptide (BNP) 2769.0 (*) 0 - 125 pg/mL  TROPONIN I     Status: None   Collection Time    04/05/13  6:38 AM      Result Value Range   Troponin I <0.30  <0.30 ng/mL   Comment:            Due to the release kinetics of cTnI,     a negative result within the first hours     of the onset of symptoms does not rule out     myocardial infarction with certainty.     If myocardial infarction is still suspected,     repeat the test at appropriate intervals.  CARBOXYHEMOGLOBIN     Status: Abnormal   Collection Time    04/05/13  7:25 AM      Result Value Range   Total hemoglobin 9.0 (*) 13.5 - 18.0 g/dL   O2 Saturation 99.4     Carboxyhemoglobin 2.9 (*) 0.5 - 1.5 %   Methemoglobin 1.2  0.0 - 1.5 %   Total oxygen content 13.1 (*) 15.0 - 23.0 mL/dL  URINALYSIS, ROUTINE W REFLEX MICROSCOPIC     Status: Abnormal   Collection Time    04/05/13  7:27 AM  Result Value Range   Color, Urine YELLOW  YELLOW   APPearance CLEAR  CLEAR   Specific Gravity, Urine 1.025  1.005 - 1.030   pH 6.0  5.0 - 8.0   Glucose, UA 100 (*) NEGATIVE mg/dL   Hgb urine dipstick SMALL (*) NEGATIVE   Bilirubin Urine NEGATIVE  NEGATIVE   Ketones, ur NEGATIVE  NEGATIVE mg/dL   Protein, ur 100 (*) NEGATIVE mg/dL   Urobilinogen, UA 0.2  0.0 - 1.0 mg/dL    Nitrite NEGATIVE  NEGATIVE   Leukocytes, UA NEGATIVE  NEGATIVE  URINE MICROSCOPIC-ADD ON     Status: Abnormal   Collection Time    04/05/13  7:27 AM      Result Value Range   WBC, UA 0-2  <3 WBC/hpf   RBC / HPF TOO NUMEROUS TO COUNT  <3 RBC/hpf   Bacteria, UA MANY (*) RARE  BLOOD GAS, ARTERIAL     Status: Abnormal   Collection Time    04/05/13  9:50 AM      Result Value Range   FIO2 50.00     Delivery systems VENTILATOR     Mode PRESSURE REGULATED VOLUME CONTROL     VT 500     Rate 15     Peep/cpap 5.0     pH, Arterial 7.260 (*) 7.350 - 7.450   pCO2 arterial 44.6  35.0 - 45.0 mmHg   pO2, Arterial 114.0 (*) 80.0 - 100.0 mmHg   Bicarbonate 19.4 (*) 20.0 - 24.0 mEq/L   TCO2 19.0  0 - 100 mmol/L   Acid-base deficit 6.5 (*) 0.0 - 2.0 mmol/L   O2 Saturation 97.7     Patient temperature 36.7     Collection site RIGHT BRACHIAL     Drawn by [id number]     Sample type ARTERIAL     Allens test (pass/fail) PASS  PASS  MRSA PCR SCREENING     Status: None   Collection Time    04/05/13 12:30 PM      Result Value Range   MRSA by PCR NEGATIVE  NEGATIVE   Comment:            The GeneXpert MRSA Assay (FDA     approved for NASAL specimens     only), is one component of a     comprehensive MRSA colonization     surveillance program. It is not     intended to diagnose MRSA     infection nor to guide or     monitor treatment for     MRSA infections.  GLUCOSE, CAPILLARY     Status: Abnormal   Collection Time    04/05/13  8:09 PM      Result Value Range   Glucose-Capillary 124 (*) 70 - 99 mg/dL   Comment 1 Documented in Chart     Comment 2 Notify RN    RETICULOCYTES     Status: Abnormal   Collection Time    04/05/13  8:30 PM      Result Value Range   Retic Ct Pct 3.3 (*) 0.4 - 3.1 %   RBC. 4.47  4.22 - 5.81 MIL/uL   Retic Count, Manual 147.5  19.0 - 186.0 K/uL  GLUCOSE, CAPILLARY     Status: Abnormal   Collection Time    04/05/13 11:38 PM      Result Value Range   Glucose-Capillary  118 (*) 70 - 99 mg/dL   Comment 1 Notify RN  Comment 2 Documented in Chart    GLUCOSE, CAPILLARY     Status: Abnormal   Collection Time    04/06/13  4:16 AM      Result Value Range   Glucose-Capillary 102 (*) 70 - 99 mg/dL   Comment 1 Notify RN     Comment 2 Documented in Chart    TRIGLYCERIDES     Status: None   Collection Time    04/06/13  4:48 AM      Result Value Range   Triglycerides 137  <150 mg/dL  BASIC METABOLIC PANEL     Status: Abnormal   Collection Time    04/06/13  4:48 AM      Result Value Range   Sodium 139  137 - 147 mEq/L   Potassium 4.9  3.7 - 5.3 mEq/L   Comment: DELTA CHECK NOTED   Chloride 103  96 - 112 mEq/L   CO2 20  19 - 32 mEq/L   Glucose, Bld 119 (*) 70 - 99 mg/dL   BUN 34 (*) 6 - 23 mg/dL   Creatinine, Ser 2.68 (*) 0.50 - 1.35 mg/dL   Calcium 8.4  8.4 - 10.5 mg/dL   GFR calc non Af Amer 23 (*) >90 mL/min   GFR calc Af Amer 26 (*) >90 mL/min   Comment: (NOTE)     The eGFR has been calculated using the CKD EPI equation.     This calculation has not been validated in all clinical situations.     eGFR's persistently <90 mL/min signify possible Chronic Kidney     Disease.  CBC     Status: Abnormal   Collection Time    04/06/13  4:48 AM      Result Value Range   WBC 12.0 (*) 4.0 - 10.5 K/uL   RBC 3.59 (*) 4.22 - 5.81 MIL/uL   Hemoglobin 7.5 (*) 13.0 - 17.0 g/dL   HCT 25.5 (*) 39.0 - 52.0 %   MCV 71.0 (*) 78.0 - 100.0 fL   MCH 20.9 (*) 26.0 - 34.0 pg   MCHC 29.4 (*) 30.0 - 36.0 g/dL   RDW 18.0 (*) 11.5 - 15.5 %   Platelets 104 (*) 150 - 400 K/uL   Comment: SPECIMEN CHECKED FOR CLOTS     PLATELET COUNT CONFIRMED BY SMEAR  BLOOD GAS, ARTERIAL     Status: Abnormal   Collection Time    04/06/13  4:50 AM      Result Value Range   FIO2 45.00     Delivery systems VENTILATOR     Mode PRESSURE REGULATED VOLUME CONTROL     VT 500     Rate 15.0     Peep/cpap 5.0     pH, Arterial 7.314 (*) 7.350 - 7.450   pCO2 arterial 39.9  35.0 - 45.0 mmHg    pO2, Arterial 135.0 (*) 80.0 - 100.0 mmHg   Bicarbonate 19.7 (*) 20.0 - 24.0 mEq/L   TCO2 18.2  0 - 100 mmol/L   Acid-base deficit 5.4 (*) 0.0 - 2.0 mmol/L   O2 Saturation 98.7     Patient temperature 37.0     Collection site LEFT RADIAL     Drawn by [id number]     Sample type ARTERIAL     Allens test (pass/fail) PASS  PASS  GLUCOSE, CAPILLARY     Status: Abnormal   Collection Time    04/06/13  8:05 AM      Result Value Range   Glucose-Capillary  104 (*) 70 - 99 mg/dL    Portable Chest Xray In Am  04/06/2013   CLINICAL DATA:  Respiratory failure.  Hypertension.  Hyperlipidemia.  EXAM: PORTABLE CHEST - 1 VIEW  COMPARISON:  DG CHEST 1V PORT dated 04/05/2013; DG CHEST 1V PORT dated 04/05/2013  FINDINGS: Endotracheal tube 4.1 cm above carina. Nasogastric tube terminates at the lower thoracic esophagus.  Cardiomegaly accentuated by AP portable technique. Atherosclerosis in the transverse aorta. Small bilateral pleural effusions. Right apex excluded. No pneumothorax. Improvement in right greater than left interstitial and lower lobe predominant airspace disease. Suspect underlying COPD.  IMPRESSION: Nasogastric tube which terminates at the low thoracic esophagus. This should be advanced. These results will be called to the ordering clinician or representative by the Radiologist Assistant, and communication documented in the PACS Dashboard.  Improved aeration with edema and/or infection persisting. Greater on the right.  Small bilateral pleural effusions.   Electronically Signed   By: Jeronimo Greaves M.D.   On: 04/06/2013 07:42   Dg Chest Port 1 View  04/05/2013   CLINICAL DATA:  Intubation  EXAM: PORTABLE CHEST - 1 VIEW  COMPARISON:  04/05/2013  FINDINGS: Endotracheal tube in satisfactory position. NG tube is in place with the tip not visualized.  Severe bilateral airspace disease is unchanged and most consistent with edema.  IMPRESSION: Endotracheal tube in satisfactory position  No change in severe bilateral  edema.   Electronically Signed   By: Marlan Palau M.D.   On: 04/05/2013 07:25   Dg Chest Port 1 View  04/05/2013   CLINICAL DATA:  Respiratory distress.  History of CABG  EXAM: PORTABLE CHEST - 1 VIEW  COMPARISON:  11/13/2011  FINDINGS: Interstitial and alveolar edema. Small bilateral pleural effusions. Background COPD with chronic lung hyperinflation. Cardiomegaly, status post CABG.  IMPRESSION: CHF.   Electronically Signed   By: Tiburcio Pea M.D.   On: 04/05/2013 07:04    Review of Systems  Unable to perform ROS: intubated   Blood pressure 100/68, pulse 87, temperature 98.8 F (37.1 C), temperature source Core (Comment), resp. rate 19, height 5\' 10"  (1.778 m), weight 65.4 kg (144 lb 2.9 oz), SpO2 100.00%. Physical Exam  Eyes: No scleral icterus.  Neck: No JVD present.  Cardiovascular: Normal rate and regular rhythm.   Respiratory: He has wheezes. He has rales.  GI: He exhibits no distension.  Musculoskeletal: He exhibits no edema.    Assessment/Plan:  problem #1 acute kidney injury: Most likely ATN versus prerenal syndrome/acute renal. Patient presently none oliguric and his BUN and creatinine is increasing. Problem #2 chronic renal failure: As stated above his creatinine on 11/15/2011 was 1.64 his GFR is 48 cc per minute. Hence stage IIIa. The etiology could be ischemic versus hypertensive nephrosclerosis. Problem #3 respiratory failure : Possibly combination of CHF and COPD. Presently his blood gas seems to be improving. Patient is being followed by Dr. 11/17/2011. Problem #4 hypertension his blood pressure seems to be reasonably controlled.  problem #5 history of coronary artery disease status post CABG Problem #6 moderate mitral valve regurgitation Problem #7 history of anemia: Patient has previous history of GI bleeding. His hemoglobin and hematocrit is declining. Problem #8 proteinuria: None nephrotic range. Most likely secondary to hypertensive nephrosclerosis.  Presently as  etiology is cannot ruled out. Plan: Agree with diuretics. Will do ultrasound of the kidneys. We'll check urine protein to creatinine ratio We'll check his basic metabolic panel, intact PTH, phosphorus in the morning.  [name] S 04/06/2013, 8:34  AM

## 2013-04-06 NOTE — Progress Notes (Signed)
*  PRELIMINARY RESULTS* Echocardiogram 2D Echocardiogram has been performed.  Wadley, Shelby 04/06/2013, 4:27 PM

## 2013-04-06 NOTE — Consult Note (Signed)
Referring Provider: No ref. provider found Primary Care Physician:  Rosita Fire, MD Primary Gastroenterologist:  Dr. Laural Golden  Reason for Consultation:  Coffee ground emesis and anemia in a patient who is on ventilatory support.  History is obtained from patient's chart, nursing staff and the patient's son Mr. Hermina Staggers  HPI:  Patient is 68 year old Afro-American male who was found to be unresponsive at home and brought to the emergency room where he was intubated and admitted to ICU. On admission patient had hemoglobin of 8.9 g and was 7.5 g this morning. NG tube was placed with coffee ground return. No fresh blood was noted. Patient is to receive 1 unit of PRBCs. No melena has been reported. Patient's home meds include atenolol. There is no history of NSAID use. According to his son he was doing fine when he saw him 3 weeks ago. Patient was found to be unresponsive by his brother yesterday. Patient is venous evaluated by Dr. Hinda Lenis for elevated serum creatinine. He also has elevated troponin level and cardiology evaluation is underway.  Patient's prior GI history is as follows; He had colonoscopy by me in November 2006 with removal of 2 large polyps in rectosigmoid junction. One polyp was tubular adenoma with high-grade dysplasia. Second polyp was tubulovillous adenoma. He did not return for followup colonoscopy as recommended. He underwent EGD and colonoscopy in August 2013 when he presented with profound anemia. These studies were performed by Dr. Barney Drain. EGD revealed gastritis and biopsy was negative for H. Pylori. He had 13 polyps removed at colonoscopy 3 of which were 8-12 mm. All of these polyps were tubular adenomas and followup exam is advised in 3 years.  Past Medical History  Diagnosis Date  . Arteriosclerotic cardiovascular disease (ASCVD) 2005    Presented with cardiac arrest and was found to have inferobasilar scarring without critical coronary disease;  catheterization in 2008 revealed three-vessel disease prompting CABG surgery  . Hypertension   . Hyperlipidemia   . GERD (gastroesophageal reflux disease)     Past Surgical History  Procedure Laterality Date  . Coronary artery bypass graft  2008  . Cholecystectomy    . Soft tissue cyst excision      Forehead  . Esophagogastroduodenoscopy  11/14/2011    Procedure: ESOPHAGOGASTRODUODENOSCOPY (EGD);  Surgeon: Danie Binder, MD;  Location: [facility];  Service: Endoscopy;  Laterality: N/A;  . Colonoscopy  11/14/2011    Procedure: COLONOSCOPY;  Surgeon: Danie Binder, MD;  Location: [facility];  Service: Endoscopy;  Laterality: N/A;    Prior to Admission medications   Medication Sig Start Date End Date Taking? Authorizing Provider  atenolol (TENORMIN) 25 MG tablet Take 25 mg by mouth 2 (two) times daily.   Yes Historical Provider, MD    Current Facility-Administered Medications  Medication Dose Route Frequency Provider Last Rate Last Dose  . antiseptic oral rinse (BIOTENE) solution 15 mL  15 mL Mouth Rinse QID Alonza Bogus, MD   15 mL at 04/06/13 0400  . cefTRIAXone (ROCEPHIN) 1 g in dextrose 5 % 50 mL IVPB  1 g Intravenous Q24H Alonza Bogus, MD      . chlorhexidine (PERIDEX) 0.12 % solution 15 mL  15 mL Mouth Rinse BID Alonza Bogus, MD   15 mL at 04/06/13 0759  . diphenhydrAMINE (BENADRYL) injection 25 mg  25 mg Intravenous Once Alonza Bogus, MD      . enoxaparin (LOVENOX) injection 30 mg  30 mg Subcutaneous Q24H Percell Miller  Emilie Rutter, MD      . fentaNYL (SUBLIMAZE) injection 50 mcg  50 mcg Intravenous Q15 min PRN Alonza Bogus, MD      . fentaNYL (SUBLIMAZE) injection 50 mcg  50 mcg Intravenous Q2H PRN Alonza Bogus, MD      . furosemide (LASIX) injection 40 mg  40 mg Intravenous Q8H Alonza Bogus, MD   40 mg at 04/06/13 0150  . influenza vac split quadrivalent PF (FLUARIX) injection 0.5 mL  0.5 mL Intramuscular Tomorrow-1000 Rosita Fire, MD      . insulin  aspart (novoLOG) injection 0-9 Units  0-9 Units Subcutaneous Q4H Rosita Fire, MD   1 Units at 04/05/13 2024  . ipratropium-albuterol (DUONEB) 0.5-2.5 (3) MG/3ML nebulizer solution 3 mL  3 mL Nebulization Q4H Rosita Fire, MD   3 mL at 04/06/13 0751  . metoCLOPramide (REGLAN) injection 10 mg  10 mg Intravenous Once Rogene Houston, MD      . pantoprazole (PROTONIX) injection 40 mg  40 mg Intravenous Daily Alonza Bogus, MD   40 mg at 04/05/13 1055  . pneumococcal 23 valent vaccine (PNU-IMMUNE) injection 0.5 mL  0.5 mL Intramuscular Tomorrow-1000 Rosita Fire, MD      . propofol (DIPRIVAN) 10 mg/ml infusion   Intravenous Titrated Delora Fuel, MD 40.3 mL/hr at 04/06/13 1003 1,000 mg at 04/06/13 1003    Allergies as of 04/05/2013  . (No Known Allergies)    No family history on file.  History   Social History  . Marital Status: Single    Spouse Name: N/A    Number of Children: N/A  . Years of Education: N/A   Occupational History  . Not on file.   Social History Main Topics  . Smoking status: Current Every Day Smoker  . Smokeless tobacco: Not on file  . Alcohol Use: Yes     Comment: Occ  . Drug Use: Not on file  . Sexual Activity: Not on file   Other Topics Concern  . Not on file   Social History Narrative  . No narrative on file    Review of Systems: See HPI, otherwise normal ROS  Physical Exam: Temp:  [96 F (35.6 C)-99.4 F (37.4 C)] 98.8 F (37.1 C) (01/09 0630) Pulse Rate:  [43-88] 86 (01/09 0945) Resp:  [14-22] 20 (01/09 0945) BP: (73-145)/(34-79) 128/52 mmHg (01/09 0945) SpO2:  [98 %-100 %] 100 % (01/09 0945) FiO2 (%):  [45 %] 45 % (01/09 0800) Weight:  [144 lb 2.9 oz (65.4 kg)] 144 lb 2.9 oz (65.4 kg) (01/08 1059) Last BM Date:  (unknown) Patient is a well-developed thin African American male who is well sedated while on ventilatory support. NG tube has coffee-ground material and gastric lavage was performed. No fresh blood noted. Conjunctiva is  pale. Sclerae is nonicteric. Pupils are small but equal. He does not have nuchal rigidity. No thyromegaly or lymphadenopathy noted. Cardiac exam with regular rhythm normal S1 and S2. No murmur or gallop noted. Auscultation of the lungs reveal vesicular breath sounds and he has rales at left base. Abdomen is symmetrical. Bowel sounds are normal. He has faint bruit in the epigastrium. Abdomen is soft and nontender without organomegaly or masses. No peripheral edema or clubbing noted.    Lab Results:  Recent Labs  04/05/13 0638 04/06/13 0448  WBC 17.5* 12.0*  HGB 8.9* 7.5*  HCT 31.3* 25.5*  PLT 86* 104*   BMET  Recent Labs  04/05/13 4742 04/06/13 0448  NA 138 139  K 3.4* 4.9  CL 102 103  CO2 19 20  GLUCOSE 296* 119*  BUN 23 34*  CREATININE 1.85* 2.68*  CALCIUM 8.8 8.4   LFT No results found for this basename: PROT, ALBUMIN, AST, ALT, ALKPHOS, BILITOT, BILIDIR, IBILI,  in the last 72 hours PT/INR  Recent Labs  04/06/13 0907  LABPROT 13.9  INR 1.09   Hepatitis Panel No results found for this basename: HEPBSAG, HCVAB, HEPAIGM, HEPBIGM,  in the last 72 hours  Studies/Results: Portable Chest Xray In Am  04/06/2013   CLINICAL DATA:  Respiratory failure.  Hypertension.  Hyperlipidemia.  EXAM: PORTABLE CHEST - 1 VIEW  COMPARISON:  DG CHEST 1V PORT dated 04/05/2013; DG CHEST 1V PORT dated 04/05/2013  FINDINGS: Endotracheal tube 4.1 cm above carina. Nasogastric tube terminates at the lower thoracic esophagus.  Cardiomegaly accentuated by AP portable technique. Atherosclerosis in the transverse aorta. Small bilateral pleural effusions. Right apex excluded. No pneumothorax. Improvement in right greater than left interstitial and lower lobe predominant airspace disease. Suspect underlying COPD.  IMPRESSION: Nasogastric tube which terminates at the low thoracic esophagus. This should be advanced. These results will be called to the ordering clinician or representative by the Radiologist  Assistant, and communication documented in the PACS Dashboard.  Improved aeration with edema and/or infection persisting. Greater on the right.  Small bilateral pleural effusions.   Electronically Signed   By: Abigail Miyamoto M.D.   On: 04/06/2013 07:42   Dg Chest Port 1 View  04/05/2013   CLINICAL DATA:  Intubation  EXAM: PORTABLE CHEST - 1 VIEW  COMPARISON:  04/05/2013  FINDINGS: Endotracheal tube in satisfactory position. NG tube is in place with the tip not visualized.  Severe bilateral airspace disease is unchanged and most consistent with edema.  IMPRESSION: Endotracheal tube in satisfactory position  No change in severe bilateral edema.   Electronically Signed   By: Franchot Gallo M.D.   On: 04/05/2013 07:25   Dg Chest Port 1 View  04/05/2013   CLINICAL DATA:  Respiratory distress.  History of CABG  EXAM: PORTABLE CHEST - 1 VIEW  COMPARISON:  11/13/2011  FINDINGS: Interstitial and alveolar edema. Small bilateral pleural effusions. Background COPD with chronic lung hyperinflation. Cardiomegaly, status post CABG.  IMPRESSION: CHF.   Electronically Signed   By: Jorje Guild M.D.   On: 04/05/2013 07:04    Assessment; Patient is 68 year old African American male who was admitted to this facility yesterday after having been found unresponsive at home. He was intubated in the emergency room and now on ventilatory support. He was felt to be in CHF and also receiving antibiotic for possible pneumonia. NG tube was placed earlier today revealing coffee-ground material. His hemoglobin has dropped from 8.9 admission to 7.5 g. He is to receive a unit of PRBCs today. Suspect peptic ulcer disease or stress ulceration secondary to acute illness. He did undergo EGD in August 2013 along with colonoscopy when he presented with shortness of breath and found to have profound anemia and evidence of iron deficiency. He had multiple IE 13 tubular adenomas removed from his colon. Upper GI tract needs to be examined in order  to find and if possible controlled a source of GI bleed. Patient is currently on IV pantoprazole. Patient also has mildly elevated troponin level and is being evaluated by cardiology service. Patient does not have localizing signs. I did talk with Dr. Luan Pulling about getting a head CT and he is agreeable.  He's  also been evaluated by Dr. Hinda Lenis of nephrology service   Recommendations; Hold enoxaparin for now. Saline gastric lavage until return was clear. Will check INR with next blood draw. Metoclopramide 10 mg IV once. Esophagogastroduodenoscopy at bedside this morning. I have contacted patient's son Hermina Staggers who is agreeable to proceed with the procedure.   LOS: 1 day   Toye Rouillard U  04/06/2013, 10:40 AM

## 2013-04-07 DIAGNOSIS — K922 Gastrointestinal hemorrhage, unspecified: Secondary | ICD-10-CM

## 2013-04-07 LAB — BLOOD GAS, ARTERIAL
ACID-BASE EXCESS: 0.2 mmol/L (ref 0.0–2.0)
Bicarbonate: 24.6 mEq/L — ABNORMAL HIGH (ref 20.0–24.0)
Drawn by: 105551
FIO2: 0.35 %
O2 SAT: 96.7 %
PATIENT TEMPERATURE: 37
PEEP: 5 cmH2O
RATE: 15 resp/min
TCO2: 23 mmol/L (ref 0–100)
VT: 500 mL
pCO2 arterial: 42.2 mmHg (ref 35.0–45.0)
pH, Arterial: 7.383 (ref 7.350–7.450)
pO2, Arterial: 85.2 mmHg (ref 80.0–100.0)

## 2013-04-07 LAB — GLUCOSE, CAPILLARY
GLUCOSE-CAPILLARY: 97 mg/dL (ref 70–99)
GLUCOSE-CAPILLARY: 85 mg/dL (ref 70–99)
Glucose-Capillary: 88 mg/dL (ref 70–99)
Glucose-Capillary: 111 mg/dL — ABNORMAL HIGH (ref 70–99)
Glucose-Capillary: 98 mg/dL (ref 70–99)

## 2013-04-07 LAB — BASIC METABOLIC PANEL
BUN: 35 mg/dL — AB (ref 6–23)
CO2: 25 meq/L (ref 19–32)
CREATININE: 2.48 mg/dL — AB (ref 0.50–1.35)
Calcium: 8.9 mg/dL (ref 8.4–10.5)
Chloride: 106 mEq/L (ref 96–112)
GFR calc Af Amer: 29 mL/min — ABNORMAL LOW (ref >90)
GFR calc non Af Amer: 25 mL/min — ABNORMAL LOW (ref >90)
Glucose, Bld: 94 mg/dL (ref 70–99)
Potassium: 4.5 mEq/L (ref 3.7–5.3)
Sodium: 144 mEq/L (ref 137–147)

## 2013-04-07 LAB — PHOSPHORUS: Phosphorus: 4.1 mg/dL (ref 2.3–4.6)

## 2013-04-07 LAB — TYPE AND SCREEN
ABO/RH(D): A POS
Antibody Screen: NEGATIVE
UNIT DIVISION: 0
Unit division: 0

## 2013-04-07 LAB — CBC
HCT: 33.9 % — ABNORMAL LOW (ref 39.0–52.0)
Hemoglobin: 10.2 g/dL — ABNORMAL LOW (ref 13.0–17.0)
MCH: 22.1 pg — ABNORMAL LOW (ref 26.0–34.0)
MCHC: 30.1 g/dL (ref 30.0–36.0)
MCV: 73.4 fL — ABNORMAL LOW (ref 78.0–100.0)
Platelets: 156 10*3/uL (ref 150–400)
RBC: 4.62 MIL/uL (ref 4.22–5.81)
RDW: 18 % — AB (ref 11.5–15.5)
WBC: 13.7 10*3/uL — AB (ref 4.0–10.5)

## 2013-04-07 LAB — IRON AND TIBC
IRON: 74 ug/dL (ref 42–135)
SATURATION RATIOS: 22 % (ref 20–55)
TIBC: 339 ug/dL (ref 215–435)
UIBC: 265 ug/dL (ref 125–400)

## 2013-04-07 LAB — FERRITIN: FERRITIN: 34 ng/mL (ref 22–322)

## 2013-04-07 LAB — VITAMIN D 25 HYDROXY (VIT D DEFICIENCY, FRACTURES): Vit D, 25-Hydroxy: <10 ng/mL — ABNORMAL LOW (ref 30–89)

## 2013-04-07 MED ORDER — BIOTENE DRY MOUTH MT LIQD: 15.0000 mL | Freq: Two times a day (BID) | OROMUCOSAL | Status: DC

## 2013-04-07 NOTE — Progress Notes (Signed)
Late entry - leak test was performed at 1115. Positive result  And patient extubated.

## 2013-04-07 NOTE — Progress Notes (Signed)
Patient remained stable; may get extubated later today Discussed with nursing staff NG aspirate has been clear. No hematemesis or melena. Hemoglobin 10.2; up from 7.5 after 2 units of packed red blood cells yesterday. EGD findings reviewed with Dr. Laural Golden   Vital signs in last 24 hours: Temp:  [97.2 F (36.2 C)-98.5 F (36.9 C)] 98.5 F (36.9 C) (01/09 2045) Pulse Rate:  [59-138] 77 (01/10 0615) Resp:  [14-23] 15 (01/09 1415) BP: (87-151)/(41-90) 104/58 mmHg (01/10 0615) SpO2:  [98 %-100 %] 99 % (01/10 0615) FiO2 (%):  [35 %-45 %] 35 % (01/10 0702) Weight:  [145 lb (65.772 kg)] 145 lb (65.772 kg) (01/10 0558) Last BM Date:  (prior to admission) General: Appears disheveled. Remains intubated. Opens eyes. Appears to be in no acute distress. NG aspirate clear Abdomen:  Abdomen nondistended.  Bowel sounds present. Abdomen is soft.  No mass or organomegaly. Extremities:  Without clubbing or edema.    Intake/Output from previous day: 01/09 0701 - 01/10 0700 In: 1699.8 [I.V.:250; Blood:325; NG/GT:1074.8; IV Piggyback:50] Out: 5400 [Urine:4350; Emesis/NG output:1050] Intake/Output this shift:    Lab Results:  Recent Labs  04/05/13 0638 04/06/13 0448 04/07/13 0544  WBC 17.5* 12.0* 13.7*  HGB 8.9* 7.5* 10.2*  HCT 31.3* 25.5* 33.9*  PLT 86* 104* 156   BMET  Recent Labs  04/05/13 0638 04/06/13 0448 04/07/13 0544  NA 138 139 144  K 3.4* 4.9 4.5  CL 102 103 106  CO2 19 20 25   GLUCOSE 296* 119* 94  BUN 23 34* 35*  CREATININE 1.85* 2.68* 2.48*  CALCIUM 8.8 8.4 8.9    Impression:  GI bleed appears to have resolved. Findings as noted. Gastric nodule biopsies pending  Recommendations:   Continue PPI. Followup on pathology. No Lovenox for another 48 hours.

## 2013-04-07 NOTE — Progress Notes (Signed)
Subjective: Patient is more alert and following commands. He remained on vent. Her was transfused and had EGD done yesterday.  Objective: Vital signs in last 24 hours: Temp:  [97.2 F (36.2 C)-98.5 F (36.9 C)] 98.5 F (36.9 C) (01/09 2045) Pulse Rate:  [59-138] 79 (01/10 0900) Resp:  [14-23] 15 (01/10 0900) BP: (87-151)/(41-90) 137/69 mmHg (01/10 0900) SpO2:  [96 %-100 %] 99 % (01/10 0900) FiO2 (%):  [35 %-45 %] 35 % (01/10 0702) Weight:  [65.772 kg (145 lb)] 65.772 kg (145 lb) (01/10 0558) Weight change: 0.372 kg (13.1 oz) Last BM Date:  (prior to admission)  Intake/Output from previous day: 01/09 0701 - 01/10 0700 In: 1699.8 [I.V.:250; Blood:325; NG/GT:1074.8; IV Piggyback:50] Out: 5400 [Urine:4350; Emesis/NG output:1050]  PHYSICAL EXAM General appearance: sedated and on vent Resp: diminished breath sounds bilaterally and rhonchi bilaterally Cardio: S1, S2 normal GI: soft, non-tender; bowel sounds normal; no masses,  no organomegaly Extremities: extremities normal, atraumatic, no cyanosis or edema  Lab Results:    @labtest @ [facility]  Recent Labs  04/07/13 0612  PHART 7.383  PO2ART 85.2  TCO2 23.0  HCO3 24.6*   CULTURES Recent Results (from the past 240 hour(s))  URINE CULTURE     Status: None   Collection Time    04/05/13  8:21 AM      Result Value Range Status   Specimen Description URINE, CATHETERIZED   Final   Special Requests NONE   Final   Culture  Setup Time     Final   Value: 04/05/2013 14:37     Performed at [place]     Final   Value: NO GROWTH     Performed at [facility]   Culture     Final   Value: NO GROWTH     Performed at [facility]   Report Status 04/06/2013 FINAL   Final  MRSA PCR SCREENING     Status: None   Collection Time    04/05/13 12:30 PM      Result Value Range Status   MRSA by PCR NEGATIVE  NEGATIVE Final   Comment:            The GeneXpert MRSA Assay (FDA     approved for  NASAL specimens     only), is one component of a     comprehensive MRSA colonization     surveillance program. It is not     intended to diagnose MRSA     infection nor to guide or     monitor treatment for     MRSA infections.   Studies/Results: Dg Abd 1 View  04/06/2013   CLINICAL DATA:  68 year old male status post NG tube placement. Initial encounter.  EXAM: ABDOMEN - 1 VIEW  COMPARISON:  Chest radiographs 0616 hr the same day.  FINDINGS: AP view at 1235 hrs. Enteric tube is been advanced. Side hole now projects at the level of the gastric fundus. Non obstructed bowel gas pattern. Right upper quadrant surgical clips. Extensive Aortoiliac calcified atherosclerosis noted. Foley catheter or rectal catheter/temperature probe projects over the midline lower pelvis.  IMPRESSION: NG tube in good position, side hole the level of the gastric fundus.   Electronically Signed   By: Lars Pinks M.D.   On: 04/06/2013 13:30   Ct Head Wo Contrast  04/06/2013   CLINICAL DATA:  Altered level of consciousness.  Ventilated patient.  EXAM: CT HEAD WITHOUT CONTRAST  TECHNIQUE: Contiguous  axial images were obtained from the base of the skull through the vertex without intravenous contrast.  COMPARISON:  None.  FINDINGS: The ventricles are normal in configuration. There is age related ventricular and sulcal enlargement. No hydrocephalus.  There are no parenchymal masses or mass effect. There is patchy white matter hypoattenuation most consistent with mild chronic microvascular ischemic change there is no evidence of a cortical infarct.  There are no extra-axial masses or abnormal fluid collections.  There is no intracranial hemorrhage.  The visualized sinuses and mastoid air cells are clear. No skull lesion.  There are dense calcifications along the cavernous portions of the internal carotid arteries.  IMPRESSION: 1. No acute findings. 2. Age related volume loss. Mild chronic microvascular ischemic change.   Electronically  Signed   By: Lajean Manes M.D.   On: 04/06/2013 13:55   Portable Chest Xray In Am  04/06/2013   CLINICAL DATA:  Respiratory failure.  Hypertension.  Hyperlipidemia.  EXAM: PORTABLE CHEST - 1 VIEW  COMPARISON:  DG CHEST 1V PORT dated 04/05/2013; DG CHEST 1V PORT dated 04/05/2013  FINDINGS: Endotracheal tube 4.1 cm above carina. Nasogastric tube terminates at the lower thoracic esophagus.  Cardiomegaly accentuated by AP portable technique. Atherosclerosis in the transverse aorta. Small bilateral pleural effusions. Right apex excluded. No pneumothorax. Improvement in right greater than left interstitial and lower lobe predominant airspace disease. Suspect underlying COPD.  IMPRESSION: Nasogastric tube which terminates at the low thoracic esophagus. This should be advanced. These results will be called to the ordering clinician or representative by the Radiologist Assistant, and communication documented in the PACS Dashboard.  Improved aeration with edema and/or infection persisting. Greater on the right.  Small bilateral pleural effusions.   Electronically Signed   By: Abigail Miyamoto M.D.   On: 04/06/2013 07:42    Medications: I have reviewed the patient's current medications.  Assesment: Active Problems:   CHF (congestive heart failure)   Elevated troponin   CAD (coronary artery disease) 1. Acute Ventilatory dependent respiratory failure  2. Pulmonary edema  3. CAD and S/P CABG  4. Acute systolic CHF  5. COPD  6. DM type II  7. Hypertension  8. Hyperlipidemia  9. GERD 10. Anemia 11. Acute on chronic renal failure  Plan: Medications reviewed Weaning trial as per pulmonary [plan. Cardiology consult appreciated Nephrology consult appreci him him he himated Will continue to monitor cbc/bmp    LOS: 2 days   [name] 04/07/2013, 10:12 AM

## 2013-04-07 NOTE — Progress Notes (Signed)
Subjective: Interval History: none.  Objective: Vital signs in last 24 hours: Temp:  [97.2 F (36.2 C)-98.5 F (36.9 C)] 98.5 F (36.9 C) (01/09 2045) Pulse Rate:  [59-138] 77 (01/10 0615) Resp:  [14-23] 15 (01/09 1415) BP: (87-151)/(41-90) 104/58 mmHg (01/10 0615) SpO2:  [98 %-100 %] 99 % (01/10 0615) FiO2 (%):  [35 %-45 %] 35 % (01/10 0702) Weight:  [65.772 kg (145 lb)] 65.772 kg (145 lb) (01/10 0558) Weight change: 0.372 kg (13.1 oz)  Intake/Output from previous day: 01/09 0701 - 01/10 0700 In: 1699.8 [I.V.:250; Blood:325; NG/GT:1074.8; IV Piggyback:50] Out: 5400 [Urine:4350; Emesis/NG output:1050] Intake/Output this shift:    Patient is awake. He remains intubated. Chest has some expiratory wheezing His heart exam regular rate and rhythm Abdomen soft positive bowel sounds Extremities no edema. Lab Results:  Recent Labs  04/06/13 0448 04/07/13 0544  WBC 12.0* 13.7*  HGB 7.5* 10.2*  HCT 25.5* 33.9*  PLT 104* 156   BMET:  Recent Labs  04/06/13 0448 04/07/13 0544  NA 139 144  K 4.9 4.5  CL 103 106  CO2 20 25  GLUCOSE 119* 94  BUN 34* 35*  CREATININE 2.68* 2.48*  CALCIUM 8.4 8.9   No results found for this basename: PTH,  in the last 72 hours Iron Studies:  Recent Labs  04/05/13 2030  IRON 19*  TIBC 340  FERRITIN 33    Studies/Results: Dg Abd 1 View  04/06/2013   CLINICAL DATA:  68 year old male status post NG tube placement. Initial encounter.  EXAM: ABDOMEN - 1 VIEW  COMPARISON:  Chest radiographs 0616 hr the same day.  FINDINGS: AP view at 1235 hrs. Enteric tube is been advanced. Side hole now projects at the level of the gastric fundus. Non obstructed bowel gas pattern. Right upper quadrant surgical clips. Extensive Aortoiliac calcified atherosclerosis noted. Foley catheter or rectal catheter/temperature probe projects over the midline lower pelvis.  IMPRESSION: NG tube in good position, side hole the level of the gastric fundus.   Electronically  Signed   By: Lars Pinks M.D.   On: 04/06/2013 13:30   Ct Head Wo Contrast  04/06/2013   CLINICAL DATA:  Altered level of consciousness.  Ventilated patient.  EXAM: CT HEAD WITHOUT CONTRAST  TECHNIQUE: Contiguous axial images were obtained from the base of the skull through the vertex without intravenous contrast.  COMPARISON:  None.  FINDINGS: The ventricles are normal in configuration. There is age related ventricular and sulcal enlargement. No hydrocephalus.  There are no parenchymal masses or mass effect. There is patchy white matter hypoattenuation most consistent with mild chronic microvascular ischemic change there is no evidence of a cortical infarct.  There are no extra-axial masses or abnormal fluid collections.  There is no intracranial hemorrhage.  The visualized sinuses and mastoid air cells are clear. No skull lesion.  There are dense calcifications along the cavernous portions of the internal carotid arteries.  IMPRESSION: 1. No acute findings. 2. Age related volume loss. Mild chronic microvascular ischemic change.   Electronically Signed   By: Lajean Manes M.D.   On: 04/06/2013 13:55   Portable Chest Xray In Am  04/06/2013   CLINICAL DATA:  Respiratory failure.  Hypertension.  Hyperlipidemia.  EXAM: PORTABLE CHEST - 1 VIEW  COMPARISON:  DG CHEST 1V PORT dated 04/05/2013; DG CHEST 1V PORT dated 04/05/2013  FINDINGS: Endotracheal tube 4.1 cm above carina. Nasogastric tube terminates at the lower thoracic esophagus.  Cardiomegaly accentuated by AP portable technique. Atherosclerosis in  the transverse aorta. Small bilateral pleural effusions. Right apex excluded. No pneumothorax. Improvement in right greater than left interstitial and lower lobe predominant airspace disease. Suspect underlying COPD.  IMPRESSION: Nasogastric tube which terminates at the low thoracic esophagus. This should be advanced. These results will be called to the ordering clinician or representative by the Radiologist Assistant, and  communication documented in the PACS Dashboard.  Improved aeration with edema and/or infection persisting. Greater on the right.  Small bilateral pleural effusions.   Electronically Signed   By: Abigail Miyamoto M.D.   On: 04/06/2013 07:42    I have reviewed the patient's current medications.  Assessment/Plan: Problem #1 acute kidney injury: His BUN and creatinine is was in acceptable range. Presently patient is none oliguric. Problem #2 respiratory failure: Patient seems to be more awake today. Problem #3 CHF is on Lasix. Patient has about 4.3 L of urine and chest x-ray show some improvement. Problem #4 anemia:. Patient iron saturation and ferritin is low hence iron deficiency anemia. Problem #5 metabolic bone disease his calcium and phosphorus was in acceptable range. His vitamin D level is very low. Presently his PTH is pending. Problem #6 history of hypertension Problem #7 coronary artery disease  Plan: We'll continue his present management We'll check his basic metabolic panel and CBC in the morning. Once patient is stable we'll start him on oral vitamin D supplement and possibly IV iron   LOS: 2 days   [name] S 04/07/2013,8:00 AM

## 2013-04-07 NOTE — Progress Notes (Signed)
Subjective: He remains intubated and on mechanical ventilation. The notes from GI cardiology and nephrology have been reviewed. He is much better this morning as far as his mental status is concerned and is able to respond to questions.  Objective: Vital signs in last 24 hours: Temp:  [97.2 F (36.2 C)-98.5 F (36.9 C)] 98.5 F (36.9 C) (01/09 2045) Pulse Rate:  [59-138] 79 (01/10 0900) Resp:  [14-23] 15 (01/10 0900) BP: (87-151)/(41-90) 137/69 mmHg (01/10 0900) SpO2:  [96 %-100 %] 99 % (01/10 0900) FiO2 (%):  [35 %-45 %] 35 % (01/10 0702) Weight:  [65.772 kg (145 lb)] 65.772 kg (145 lb) (01/10 0558) Weight change: 0.372 kg (13.1 oz) Last BM Date:  (prior to admission)  Intake/Output from previous day: 01/09 0701 - 01/10 0700 In: 1699.8 [I.V.:250; Blood:325; NG/GT:1074.8; IV Piggyback:50] Out: 5400 [Urine:4350; Emesis/NG output:1050]  PHYSICAL EXAM General appearance: Intubated and sedated but more alert Resp: rhonchi bilaterally Cardio: regular rate and rhythm, S1, S2 normal, no murmur, click, rub or gallop GI: soft, non-tender; bowel sounds normal; no masses,  no organomegaly Extremities: extremities normal, atraumatic, no cyanosis or edema  Lab Results:    Basic Metabolic Panel:  Recent Labs  04/06/13 0448 04/07/13 0544  NA 139 144  K 4.9 4.5  CL 103 106  CO2 20 25  GLUCOSE 119* 94  BUN 34* 35*  CREATININE 2.68* 2.48*  CALCIUM 8.4 8.9  PHOS  --  4.1   Liver Function Tests: No results found for this basename: AST, ALT, ALKPHOS, BILITOT, PROT, ALBUMIN,  in the last 72 hours No results found for this basename: LIPASE, AMYLASE,  in the last 72 hours No results found for this basename: AMMONIA,  in the last 72 hours CBC:  Recent Labs  04/05/13 0638 04/06/13 0448 04/07/13 0544  WBC 17.5* 12.0* 13.7*  NEUTROABS 9.6*  --   --   HGB 8.9* 7.5* 10.2*  HCT 31.3* 25.5* 33.9*  MCV 73.0* 71.0* 73.4*  PLT 86* 104* 156   Cardiac Enzymes:  Recent Labs   04/06/13 0907 04/06/13 1427 04/06/13 2112  TROPONINI 0.38* 0.35* <0.30   BNP:  Recent Labs  04/05/13 0638  PROBNP 2769.0*   D-Dimer: No results found for this basename: DDIMER,  in the last 72 hours CBG:  Recent Labs  04/06/13 1125 04/06/13 1656 04/06/13 2002 04/06/13 2341 04/07/13 0357 04/07/13 0751  GLUCAP 96 79 104* 92 97 88   Hemoglobin A1C: No results found for this basename: HGBA1C,  in the last 72 hours Fasting Lipid Panel:  Recent Labs  04/06/13 0448  TRIG 137   Thyroid Function Tests: No results found for this basename: TSH, T4TOTAL, FREET4, T3FREE, THYROIDAB,  in the last 72 hours Anemia Panel:  Recent Labs  04/05/13 2030  VITAMINB12 319  FOLATE 7.9  FERRITIN 33  TIBC 340  IRON 19*  RETICCTPCT 3.3*   Coagulation:  Recent Labs  04/06/13 0907  LABPROT 13.9  INR 1.09   Urine Drug Screen: Drugs of Abuse  No results found for this basename: labopia, cocainscrnur, labbenz, amphetmu, thcu, labbarb    Alcohol Level: No results found for this basename: ETH,  in the last 72 hours Urinalysis:  Recent Labs  04/05/13 0727  COLORURINE YELLOW  LABSPEC 1.025  PHURINE 6.0  GLUCOSEU 100*  HGBUR SMALL*  BILIRUBINUR NEGATIVE  KETONESUR NEGATIVE  PROTEINUR 100*  UROBILINOGEN 0.2  NITRITE NEGATIVE  LEUKOCYTESUR NEGATIVE   Misc. Labs:  ABGS  Recent Labs  04/07/13 [phone]  PHART 7.383  PO2ART 85.2  TCO2 23.0  HCO3 24.6*   CULTURES Recent Results (from the past 240 hour(s))  URINE CULTURE     Status: None   Collection Time    04/05/13  8:21 AM      Result Value Range Status   Specimen Description URINE, CATHETERIZED   Final   Special Requests NONE   Final   Culture  Setup Time     Final   Value: 04/05/2013 14:37     Performed at [place]     Final   Value: NO GROWTH     Performed at [facility]   Culture     Final   Value: NO GROWTH     Performed at [facility]   Report Status  04/06/2013 FINAL   Final  MRSA PCR SCREENING     Status: None   Collection Time    04/05/13 12:30 PM      Result Value Range Status   MRSA by PCR NEGATIVE  NEGATIVE Final   Comment:            The GeneXpert MRSA Assay (FDA     approved for NASAL specimens     only), is one component of a     comprehensive MRSA colonization     surveillance program. It is not     intended to diagnose MRSA     infection nor to guide or     monitor treatment for     MRSA infections.   Studies/Results: Dg Abd 1 View  04/06/2013   CLINICAL DATA:  68 year old male status post NG tube placement. Initial encounter.  EXAM: ABDOMEN - 1 VIEW  COMPARISON:  Chest radiographs 0616 hr the same day.  FINDINGS: AP view at 1235 hrs. Enteric tube is been advanced. Side hole now projects at the level of the gastric fundus. Non obstructed bowel gas pattern. Right upper quadrant surgical clips. Extensive Aortoiliac calcified atherosclerosis noted. Foley catheter or rectal catheter/temperature probe projects over the midline lower pelvis.  IMPRESSION: NG tube in good position, side hole the level of the gastric fundus.   Electronically Signed   By: Lars Pinks M.D.   On: 04/06/2013 13:30   Ct Head Wo Contrast  04/06/2013   CLINICAL DATA:  Altered level of consciousness.  Ventilated patient.  EXAM: CT HEAD WITHOUT CONTRAST  TECHNIQUE: Contiguous axial images were obtained from the base of the skull through the vertex without intravenous contrast.  COMPARISON:  None.  FINDINGS: The ventricles are normal in configuration. There is age related ventricular and sulcal enlargement. No hydrocephalus.  There are no parenchymal masses or mass effect. There is patchy white matter hypoattenuation most consistent with mild chronic microvascular ischemic change there is no evidence of a cortical infarct.  There are no extra-axial masses or abnormal fluid collections.  There is no intracranial hemorrhage.  The visualized sinuses and mastoid air cells  are clear. No skull lesion.  There are dense calcifications along the cavernous portions of the internal carotid arteries.  IMPRESSION: 1. No acute findings. 2. Age related volume loss. Mild chronic microvascular ischemic change.   Electronically Signed   By: Lajean Manes M.D.   On: 04/06/2013 13:55   Portable Chest Xray In Am  04/06/2013   CLINICAL DATA:  Respiratory failure.  Hypertension.  Hyperlipidemia.  EXAM: PORTABLE CHEST - 1 VIEW  COMPARISON:  DG CHEST 1V PORT dated 04/05/2013; DG CHEST  1V PORT dated 04/05/2013  FINDINGS: Endotracheal tube 4.1 cm above carina. Nasogastric tube terminates at the lower thoracic esophagus.  Cardiomegaly accentuated by AP portable technique. Atherosclerosis in the transverse aorta. Small bilateral pleural effusions. Right apex excluded. No pneumothorax. Improvement in right greater than left interstitial and lower lobe predominant airspace disease. Suspect underlying COPD.  IMPRESSION: Nasogastric tube which terminates at the low thoracic esophagus. This should be advanced. These results will be called to the ordering clinician or representative by the Radiologist Assistant, and communication documented in the PACS Dashboard.  Improved aeration with edema and/or infection persisting. Greater on the right.  Small bilateral pleural effusions.   Electronically Signed   By: Abigail Miyamoto M.D.   On: 04/06/2013 07:42    Medications:  Prior to Admission:  Prescriptions prior to admission  Medication Sig Dispense Refill  . atenolol (TENORMIN) 25 MG tablet Take 25 mg by mouth 2 (two) times daily.       Scheduled: . antiseptic oral rinse  15 mL Mouth Rinse QID  . cefTRIAXone (ROCEPHIN) IVPB 1 gram/50 mL D5W  1 g Intravenous Q24H  . chlorhexidine  15 mL Mouth Rinse BID  . feeding supplement (JEVITY 1.2 CAL)  1,000 mL Per Tube Q24H  . furosemide  40 mg Intravenous Q8H  . insulin aspart  0-9 Units Subcutaneous Q4H  . ipratropium-albuterol  3 mL Nebulization Q4H  . metoprolol   5 mg Intravenous Q6H  . pantoprazole (PROTONIX) IV  40 mg Intravenous Q12H   Continuous: . propofol 10 mcg/kg/min (04/07/13 0831)   TJQ:[id number], fentaNYL, fentaNYL, fentaNYL  Assesment: He was admitted with pulmonary edema. He has developed acute renal failure. He has congestive heart failure. He has known coronary artery occlusive disease. He has respiratory failure. He was very agitated yesterday but that is better today. He had some gastritis seen on EGD and did require 2 units of blood. Active Problems:   CHF (congestive heart failure)   Elevated troponin   CAD (coronary artery disease)    Plan: He may be able to be extubated today    LOS: 2 days   Teja Costen L 04/07/2013, 9:20 AM

## 2013-04-08 LAB — GLUCOSE, CAPILLARY
GLUCOSE-CAPILLARY: 89 mg/dL (ref 70–99)
GLUCOSE-CAPILLARY: 98 mg/dL (ref 70–99)
GLUCOSE-CAPILLARY: 99 mg/dL (ref 70–99)

## 2013-04-08 MED ORDER — SODIUM CHLORIDE 0.9 % IV SOLN
INTRAVENOUS | Status: DC
  Administered 2013-04-09 (×2): via INTRAVENOUS

## 2013-04-08 MED ORDER — ATENOLOL 25 MG PO TABS
25.0000 mg | ORAL_TABLET | Freq: Two times a day (BID) | ORAL | Status: DC
  Administered 2013-04-08 – 2013-04-10 (×5): 25 mg via ORAL
  Filled 2013-04-08 (×5): qty 1

## 2013-04-08 MED ORDER — PANTOPRAZOLE SODIUM 40 MG PO TBEC
40.0000 mg | DELAYED_RELEASE_TABLET | Freq: Two times a day (BID) | ORAL | Status: DC
  Administered 2013-04-08 – 2013-04-10 (×5): 40 mg via ORAL
  Filled 2013-04-08 (×5): qty 1

## 2013-04-08 MED ORDER — POLYETHYLENE GLYCOL 3350 17 G PO PACK
17.0000 g | PACK | Freq: Every day | ORAL | Status: DC
  Administered 2013-04-08 – 2013-04-10 (×3): 17 g via ORAL
  Filled 2013-04-08 (×3): qty 1

## 2013-04-08 MED ORDER — VITAMIN D (ERGOCALCIFEROL) 1.25 MG (50000 UNIT) PO CAPS
50000.0000 [IU] | ORAL_CAPSULE | ORAL | Status: DC
  Administered 2013-04-08: 50000 [IU] via ORAL
  Filled 2013-04-08: qty 1

## 2013-04-08 NOTE — Progress Notes (Signed)
Subjective: He was able to be extubated yesterday. He says he still somewhat short of breath.  Objective: Vital signs in last 24 hours: Temp:  [98.6 F (37 C)-99.1 F (37.3 C)] 98.7 F (37.1 C) (01/11 0400) Pulse Rate:  [65-99] 68 (01/11 0700) Resp:  [7-30] 13 (01/11 0700) BP: (114-162)/(51-86) 114/62 mmHg (01/11 0700) SpO2:  [90 %-100 %] 100 % (01/11 0700) FiO2 (%):  [35 %] 35 % (01/10 1130) Weight:  [57.4 kg (126 lb 8.7 oz)] 57.4 kg (126 lb 8.7 oz) (01/11 0500) Weight change: -8.372 kg (-18 lb 7.3 oz) Last BM Date:  (prior to admission)  Intake/Output from previous day: 01/10 0701 - 01/11 0700 In: 190 [NG/GT:140; IV Piggyback:50] Out: 3100 [Urine:3100]  PHYSICAL EXAM General appearance: alert, cooperative and mild distress Resp: rhonchi bilaterally Cardio: regular rate and rhythm, S1, S2 normal, no murmur, click, rub or gallop GI: soft, non-tender; bowel sounds normal; no masses,  no organomegaly Extremities: extremities normal, atraumatic, no cyanosis or edema  Lab Results:    Basic Metabolic Panel:  Recent Labs  04/06/13 0448 04/07/13 0544  NA 139 144  K 4.9 4.5  CL 103 106  CO2 20 25  GLUCOSE 119* 94  BUN 34* 35*  CREATININE 2.68* 2.48*  CALCIUM 8.4 8.9  PHOS  --  4.1   Liver Function Tests: No results found for this basename: AST, ALT, ALKPHOS, BILITOT, PROT, ALBUMIN,  in the last 72 hours No results found for this basename: LIPASE, AMYLASE,  in the last 72 hours No results found for this basename: AMMONIA,  in the last 72 hours CBC:  Recent Labs  04/06/13 0448 04/07/13 0544  WBC 12.0* 13.7*  HGB 7.5* 10.2*  HCT 25.5* 33.9*  MCV 71.0* 73.4*  PLT 104* 156   Cardiac Enzymes:  Recent Labs  04/06/13 0907 04/06/13 1427 04/06/13 2112  TROPONINI 0.38* 0.35* <0.30   BNP: No results found for this basename: PROBNP,  in the last 72 hours D-Dimer: No results found for this basename: DDIMER,  in the last 72 hours CBG:  Recent Labs   04/07/13 1140 04/07/13 1625 04/07/13 1953 04/08/13 0012 04/08/13 0446 04/08/13 0741  GLUCAP 111* 98 85 89 99 98   Hemoglobin A1C: No results found for this basename: HGBA1C,  in the last 72 hours Fasting Lipid Panel:  Recent Labs  04/06/13 0448  TRIG 137   Thyroid Function Tests: No results found for this basename: TSH, T4TOTAL, FREET4, T3FREE, THYROIDAB,  in the last 72 hours Anemia Panel:  Recent Labs  04/05/13 2030 04/07/13 0544  VITAMINB12 319  --   FOLATE 7.9  --   FERRITIN 33 34  TIBC 340 339  IRON 19* 74  RETICCTPCT 3.3*  --    Coagulation:  Recent Labs  04/06/13 0907  LABPROT 13.9  INR 1.09   Urine Drug Screen: Drugs of Abuse  No results found for this basename: labopia, cocainscrnur, labbenz, amphetmu, thcu, labbarb    Alcohol Level: No results found for this basename: ETH,  in the last 72 hours Urinalysis: No results found for this basename: COLORURINE, APPERANCEUR, LABSPEC, PHURINE, GLUCOSEU, HGBUR, BILIRUBINUR, KETONESUR, PROTEINUR, UROBILINOGEN, NITRITE, LEUKOCYTESUR,  in the last 72 hours Misc. Labs:  ABGS  Recent Labs  04/07/13 0612  PHART 7.383  PO2ART 85.2  TCO2 23.0  HCO3 24.6*   CULTURES Recent Results (from the past 240 hour(s))  URINE CULTURE     Status: None   Collection Time    04/05/13  8:21 AM      Result Value Range Status   Specimen Description URINE, CATHETERIZED   Final   Special Requests NONE   Final   Culture  Setup Time     Final   Value: 04/05/2013 14:37     Performed at [place]     Final   Value: NO GROWTH     Performed at [facility]   Culture     Final   Value: NO GROWTH     Performed at [facility]   Report Status 04/06/2013 FINAL   Final  MRSA PCR SCREENING     Status: None   Collection Time    04/05/13 12:30 PM      Result Value Range Status   MRSA by PCR NEGATIVE  NEGATIVE Final   Comment:            The GeneXpert MRSA Assay (FDA     approved for  NASAL specimens     only), is one component of a     comprehensive MRSA colonization     surveillance program. It is not     intended to diagnose MRSA     infection nor to guide or     monitor treatment for     MRSA infections.   Studies/Results: Dg Abd 1 View  04/06/2013   CLINICAL DATA:  68 year old male status post NG tube placement. Initial encounter.  EXAM: ABDOMEN - 1 VIEW  COMPARISON:  Chest radiographs 0616 hr the same day.  FINDINGS: AP view at 1235 hrs. Enteric tube is been advanced. Side hole now projects at the level of the gastric fundus. Non obstructed bowel gas pattern. Right upper quadrant surgical clips. Extensive Aortoiliac calcified atherosclerosis noted. Foley catheter or rectal catheter/temperature probe projects over the midline lower pelvis.  IMPRESSION: NG tube in good position, side hole the level of the gastric fundus.   Electronically Signed   By: Lars Pinks M.D.   On: 04/06/2013 13:30   Ct Head Wo Contrast  04/06/2013   CLINICAL DATA:  Altered level of consciousness.  Ventilated patient.  EXAM: CT HEAD WITHOUT CONTRAST  TECHNIQUE: Contiguous axial images were obtained from the base of the skull through the vertex without intravenous contrast.  COMPARISON:  None.  FINDINGS: The ventricles are normal in configuration. There is age related ventricular and sulcal enlargement. No hydrocephalus.  There are no parenchymal masses or mass effect. There is patchy white matter hypoattenuation most consistent with mild chronic microvascular ischemic change there is no evidence of a cortical infarct.  There are no extra-axial masses or abnormal fluid collections.  There is no intracranial hemorrhage.  The visualized sinuses and mastoid air cells are clear. No skull lesion.  There are dense calcifications along the cavernous portions of the internal carotid arteries.  IMPRESSION: 1. No acute findings. 2. Age related volume loss. Mild chronic microvascular ischemic change.   Electronically  Signed   By: Lajean Manes M.D.   On: 04/06/2013 13:55    Medications:  Scheduled: . antiseptic oral rinse  15 mL Mouth Rinse BID  . cefTRIAXone (ROCEPHIN) IVPB 1 gram/50 mL D5W  1 g Intravenous Q24H  . furosemide  40 mg Intravenous Q8H  . insulin aspart  0-9 Units Subcutaneous Q4H  . ipratropium-albuterol  3 mL Nebulization Q4H  . metoprolol  5 mg Intravenous Q6H  . pantoprazole (PROTONIX) IV  40 mg Intravenous Q12H  . Vitamin D (  Ergocalciferol)  50,000 Units Oral Q7 days   Continuous: . sodium chloride     DHR:[id number], fentaNYL  Assesment: He was admitted with acute respiratory failure related to pulmonary edema. This has resolved. He has developed acute on chronic renal failure and his renal function testing is not back yet this morning. Active Problems:   CHF (congestive heart failure)   Elevated troponin   CAD (coronary artery disease)    Plan: I will continue to follow for now if he continues to be okay and off the ventilator I will sign off fairly soon    LOS: 3 days   Ihsan Nomura L 04/08/2013, 8:54 AM

## 2013-04-08 NOTE — Progress Notes (Signed)
Subjective: Patient is successfully extubated. He is alert and awake. He is doing much better.  Objective: Vital signs in last 24 hours: Temp:  [98.6 F (37 C)-99.1 F (37.3 C)] 98.7 F (37.1 C) (01/11 0400) Pulse Rate:  [65-99] 68 (01/11 0700) Resp:  [7-30] 13 (01/11 0700) BP: (114-162)/(51-86) 114/62 mmHg (01/11 0700) SpO2:  [90 %-100 %] 100 % (01/11 0700) FiO2 (%):  [35 %] 35 % (01/10 1130) Weight:  [57.4 kg (126 lb 8.7 oz)] 57.4 kg (126 lb 8.7 oz) (01/11 0500) Weight change: -8.372 kg (-18 lb 7.3 oz) Last BM Date:  (prior to admission)  Intake/Output from previous day: 01/10 0701 - 01/11 0700 In: 190 [NG/GT:140; IV Piggyback:50] Out: 3100 [Urine:3100]  PHYSICAL EXAM General appearance: alert, no distress and an comfortable Resp: diminished breath sounds bilaterally and rhonchi bilaterally Cardio: S1, S2 normal GI: soft, non-tender; bowel sounds normal; no masses,  no organomegaly Extremities: extremities normal, atraumatic, no cyanosis or edema  Lab Results:    @labtest @ ABGS  Recent Labs  04/07/13 0612  PHART 7.383  PO2ART 85.2  TCO2 23.0  HCO3 24.6*   CULTURES Recent Results (from the past 240 hour(s))  URINE CULTURE     Status: None   Collection Time    04/05/13  8:21 AM      Result Value Range Status   Specimen Description URINE, CATHETERIZED   Final   Special Requests NONE   Final   Culture  Setup Time     Final   Value: 04/05/2013 14:37     Performed at [place]     Final   Value: NO GROWTH     Performed at [facility]   Culture     Final   Value: NO GROWTH     Performed at [facility]   Report Status 04/06/2013 FINAL   Final  MRSA PCR SCREENING     Status: None   Collection Time    04/05/13 12:30 PM      Result Value Range Status   MRSA by PCR NEGATIVE  NEGATIVE Final   Comment:            The GeneXpert MRSA Assay (FDA     approved for NASAL specimens     only), is one component of a      comprehensive MRSA colonization     surveillance program. It is not     intended to diagnose MRSA     infection nor to guide or     monitor treatment for     MRSA infections.   Studies/Results: Dg Abd 1 View  04/06/2013   CLINICAL DATA:  68 year old male status post NG tube placement. Initial encounter.  EXAM: ABDOMEN - 1 VIEW  COMPARISON:  Chest radiographs 0616 hr the same day.  FINDINGS: AP view at 1235 hrs. Enteric tube is been advanced. Side hole now projects at the level of the gastric fundus. Non obstructed bowel gas pattern. Right upper quadrant surgical clips. Extensive Aortoiliac calcified atherosclerosis noted. Foley catheter or rectal catheter/temperature probe projects over the midline lower pelvis.  IMPRESSION: NG tube in good position, side hole the level of the gastric fundus.   Electronically Signed   By: Lars Pinks M.D.   On: 04/06/2013 13:30   Ct Head Wo Contrast  04/06/2013   CLINICAL DATA:  Altered level of consciousness.  Ventilated patient.  EXAM: CT HEAD WITHOUT CONTRAST  TECHNIQUE: Contiguous axial images  were obtained from the base of the skull through the vertex without intravenous contrast.  COMPARISON:  None.  FINDINGS: The ventricles are normal in configuration. There is age related ventricular and sulcal enlargement. No hydrocephalus.  There are no parenchymal masses or mass effect. There is patchy white matter hypoattenuation most consistent with mild chronic microvascular ischemic change there is no evidence of a cortical infarct.  There are no extra-axial masses or abnormal fluid collections.  There is no intracranial hemorrhage.  The visualized sinuses and mastoid air cells are clear. No skull lesion.  There are dense calcifications along the cavernous portions of the internal carotid arteries.  IMPRESSION: 1. No acute findings. 2. Age related volume loss. Mild chronic microvascular ischemic change.   Electronically Signed   By: Lajean Manes M.D.   On: 04/06/2013 13:55     Medications: I have reviewed the patient's current medications.  Assesment: Active Problems:   CHF (congestive heart failure)   Elevated troponin   CAD (coronary artery disease) 1. Acute Ventilatory dependent respiratory failure  And s/p extubation 2. Pulmonary edema  3. CAD and S/P CABG  4. Acute systolic CHF  5. COPD  6. DM type II  7. Hypertension  8. Hyperlipidemia  9. GERD 10. Anemia 11. Acute on chronic renal failure  Plan: Medications reviewed Will change medications to po Cbc and BMP in am Regular diet.    LOS: 3 days   Corissa Oguinn 04/08/2013, 9:41 AM

## 2013-04-08 NOTE — Progress Notes (Signed)
Subjective: Interval History: Denies any nausea or vomiting. No difficulty in breathing.  Objective: Vital signs in last 24 hours: Temp:  [98.6 F (37 C)-99.1 F (37.3 C)] 98.7 F (37.1 C) (01/11 0400) Pulse Rate:  [65-99] 68 (01/11 0700) Resp:  [7-30] 13 (01/11 0700) BP: (114-162)/(51-86) 114/62 mmHg (01/11 0700) SpO2:  [90 %-100 %] 100 % (01/11 0700) FiO2 (%):  [35 %] 35 % (01/10 1130) Weight:  [57.4 kg (126 lb 8.7 oz)] 57.4 kg (126 lb 8.7 oz) (01/11 0500) Weight change: -8.372 kg (-18 lb 7.3 oz)  Intake/Output from previous day: 01/10 0701 - 01/11 0700 In: 190 [NG/GT:140; IV Piggyback:50] Out: 7829 [Urine:3100] Intake/Output this shift:    Patient is awake. He is not any apparent distress Chest has some expiratory wheezing His heart exam regular rate and rhythm Abdomen soft positive bowel sounds Extremities no edema. Lab Results:  Recent Labs  04/06/13 0448 04/07/13 0544  WBC 12.0* 13.7*  HGB 7.5* 10.2*  HCT 25.5* 33.9*  PLT 104* 156   BMET:   Recent Labs  04/06/13 0448 04/07/13 0544  NA 139 144  K 4.9 4.5  CL 103 106  CO2 20 25  GLUCOSE 119* 94  BUN 34* 35*  CREATININE 2.68* 2.48*  CALCIUM 8.4 8.9   No results found for this basename: PTH,  in the last 72 hours Iron Studies:   Recent Labs  04/07/13 0544  IRON 74  TIBC 339  FERRITIN 34    Studies/Results: Dg Abd 1 View  04/06/2013   CLINICAL DATA:  68 year old male status post NG tube placement. Initial encounter.  EXAM: ABDOMEN - 1 VIEW  COMPARISON:  Chest radiographs 0616 hr the same day.  FINDINGS: AP view at 1235 hrs. Enteric tube is been advanced. Side hole now projects at the level of the gastric fundus. Non obstructed bowel gas pattern. Right upper quadrant surgical clips. Extensive Aortoiliac calcified atherosclerosis noted. Foley catheter or rectal catheter/temperature probe projects over the midline lower pelvis.  IMPRESSION: NG tube in good position, side hole the level of the gastric  fundus.   Electronically Signed   By: Lars Pinks M.D.   On: 04/06/2013 13:30   Ct Head Wo Contrast  04/06/2013   CLINICAL DATA:  Altered level of consciousness.  Ventilated patient.  EXAM: CT HEAD WITHOUT CONTRAST  TECHNIQUE: Contiguous axial images were obtained from the base of the skull through the vertex without intravenous contrast.  COMPARISON:  None.  FINDINGS: The ventricles are normal in configuration. There is age related ventricular and sulcal enlargement. No hydrocephalus.  There are no parenchymal masses or mass effect. There is patchy white matter hypoattenuation most consistent with mild chronic microvascular ischemic change there is no evidence of a cortical infarct.  There are no extra-axial masses or abnormal fluid collections.  There is no intracranial hemorrhage.  The visualized sinuses and mastoid air cells are clear. No skull lesion.  There are dense calcifications along the cavernous portions of the internal carotid arteries.  IMPRESSION: 1. No acute findings. 2. Age related volume loss. Mild chronic microvascular ischemic change.   Electronically Signed   By: Lajean Manes M.D.   On: 04/06/2013 13:55    I have reviewed the patient's current medications.  Assessment/Plan: Problem #1 acute kidney injury: His BUN and creatinine is stable. Presently patient is none oliguric. Problem #2 respiratory failure: Patient seems to be more awake today. Problem #3 CHF is on Lasix. Patient has about 3.1 L of urine and  chest x-ray show some improvement. Problem #4 anemia:. Patient iron saturation and ferritin is low hence iron deficiency anemia. Problem #5 metabolic bone disease his calcium and phosphorus is with  in acceptable range. His vitamin D level is very low. Presently his PTH is pending. Problem #6 history of hypertension Problem #7 coronary artery disease  Plan: We'll start patient in ns at 75 cc/hr Ergocaliferol 50,000iu po once a week We'll check his basic metabolic panel and CBC  in the morning.    LOS: 3 days   Veatrice Eckstein S 04/08/2013,8:31 AM

## 2013-04-09 ENCOUNTER — Encounter (HOSPITAL_COMMUNITY): Payer: Self-pay | Admitting: Internal Medicine

## 2013-04-09 LAB — BASIC METABOLIC PANEL
BUN: 47 mg/dL — AB (ref 6–23)
CO2: 25 mEq/L (ref 19–32)
Calcium: 9.1 mg/dL (ref 8.4–10.5)
Chloride: 103 mEq/L (ref 96–112)
Creatinine, Ser: 1.84 mg/dL — ABNORMAL HIGH (ref 0.50–1.35)
GFR, EST AFRICAN AMERICAN: 42 mL/min — AB (ref >90)
GFR, EST NON AFRICAN AMERICAN: 36 mL/min — AB (ref >90)
Glucose, Bld: 97 mg/dL (ref 70–99)
Potassium: 4.1 mEq/L (ref 3.7–5.3)
Sodium: 142 mEq/L (ref 137–147)

## 2013-04-09 LAB — CBC WITH DIFFERENTIAL/PLATELET
Basophils Absolute: 0.1 10*3/uL (ref 0.0–0.1)
Basophils Relative: 1 % (ref 0–1)
EOS ABS: 0.1 10*3/uL (ref 0.0–0.7)
EOS PCT: 1 % (ref 0–5)
HCT: 33.2 % — ABNORMAL LOW (ref 39.0–52.0)
Hemoglobin: 10.6 g/dL — ABNORMAL LOW (ref 13.0–17.0)
Lymphocytes Relative: 22 % (ref 12–46)
Lymphs Abs: 2.6 10*3/uL (ref 0.7–4.0)
MCH: 23.4 pg — AB (ref 26.0–34.0)
MCHC: 31.9 g/dL (ref 30.0–36.0)
MCV: 73.3 fL — AB (ref 78.0–100.0)
MONOS PCT: 8 % (ref 3–12)
Monocytes Absolute: 1 10*3/uL (ref 0.1–1.0)
Neutro Abs: 8 10*3/uL — ABNORMAL HIGH (ref 1.7–7.7)
Neutrophils Relative %: 68 % (ref 43–77)
PLATELETS: 208 10*3/uL (ref 150–400)
RBC: 4.53 MIL/uL (ref 4.22–5.81)
RDW: 19.3 % — ABNORMAL HIGH (ref 11.5–15.5)
WBC: 11.8 10*3/uL — ABNORMAL HIGH (ref 4.0–10.5)

## 2013-04-09 LAB — PTH, INTACT AND CALCIUM
Calcium, Total (PTH): 8 mg/dL — ABNORMAL LOW (ref 8.4–10.5)
PTH: 606.3 pg/mL — AB (ref 14.0–72.0)

## 2013-04-09 LAB — PHOSPHORUS: Phosphorus: 4.2 mg/dL (ref 2.3–4.6)

## 2013-04-09 MED ORDER — POLYSACCHARIDE IRON COMPLEX 150 MG PO CAPS
150.0000 mg | ORAL_CAPSULE | Freq: Two times a day (BID) | ORAL | Status: DC
  Administered 2013-04-09 – 2013-04-10 (×3): 150 mg via ORAL
  Filled 2013-04-09 (×3): qty 1

## 2013-04-09 MED ORDER — IPRATROPIUM-ALBUTEROL 0.5-2.5 (3) MG/3ML IN SOLN
3.0000 mL | RESPIRATORY_TRACT | Status: DC
Start: 2013-04-09 — End: 2013-04-10
  Administered 2013-04-09 – 2013-04-10 (×8): 3 mL via RESPIRATORY_TRACT
  Filled 2013-04-09 (×8): qty 3

## 2013-04-09 NOTE — Progress Notes (Signed)
Subjective; Patient has no complaints. He denies heartburn nausea vomiting or abdominal pain. He has good appetite. No melena reported according to nursing staff. Patient recalled that he was nervous and felt weak after trip to a store to buy some snacks. He does not use OTC NSAIDs.  Objective; BP 103/52  Pulse 67  Temp(Src) 97.9 F (36.6 C) (Oral)  Resp 8  Ht 5\' 10"  (1.778 m)  Wt 136 lb 3.9 oz (61.8 kg)  BMI 19.55 kg/m2  SpO2 98% Patient is alert and in no acute distress. Abdomen is soft and nontender without organomegaly or masses.  Lab data; Gastric nodule biopsy is pending. WBC 11.8, H&H 10.6 and 33.2 and platelet count 208K. BUN 47 creatinine 1.84. Vitamin D level less than 10. Lab data from 04/05/2013. Serum iron 19, TIBC 340 and saturation 6% Serum ferritin 33 Vitamin B12 level 319 and folate 7.9  Assessment; #1. Upper GI bleed secondary to gastritis. Now inactive. He also had gastric nodule biopsied at the time of EGD and biopsy is pending. #2. Anemia secondary to upper GI bleed and he also has iron deficiency; patient is on oral iron therapy. At this point no indication for further evaluation. He received 2 units of PRBCs and his hemoglobin is now above 10 g per #3. Vitamin D deficiency. #4. Renal dysfunction. He is felt to have acute renal injury per Dr. Hinda Lenis. #5. CHF. He is on diuretic therapy an echo revealed EF of 40 to 45%.  Recommendations; Continue pantoprazole Niferex. CBC in a.m.

## 2013-04-09 NOTE — Progress Notes (Signed)
NUTRITION FOLLOW UP  Intervention:   Continue with current nutrition plan of care  Nutrition Dx:   Inadequate oral intake related to inability to eat as evidenced by NPO, mechanical ventilation; resolved  New nutrition dx: None at this time  Goal:   Pt will meet > 90% of estimated nutrition needs  Monitor:   PO intake, weight changes, skin assessments, labs  Assessment:   Pt was extubated on 04/07/13. Remains in ICU. Upgraded to a regular diet and tolerating well. PO: 100%. Per MD notes, respiratory and renal function improving.  Noted a 5.6% wt loss x 4 days, likely due to fluid loss. Pt is on Lasix. Noted no documented BM since admission.   Height: Ht Readings from Last 1 Encounters:  04/07/13 5\' 10"  (1.778 m)    Weight Status:   Wt Readings from Last 1 Encounters:  04/09/13 136 lb 3.9 oz (61.8 kg)    Re-estimated needs:  Kcal: 1300-1400 daily Protein: 50-68 grams daily Fluid: 1.3-1.4 L daily  Skin: WDL  Diet Order: General   Intake/Output Summary (Last 24 hours) at 04/09/13 1151 Last data filed at 04/09/13 0900  Gross per 24 hour  Intake   1745 ml  Output   1250 ml  Net    495 ml    Last BM: PTA   Labs:   Recent Labs Lab 04/06/13 0448 04/07/13 0544 04/09/13 0527  NA 139 144 142  K 4.9 4.5 4.1  CL 103 106 103  CO2 20 25 25   BUN 34* 35* 47*  CREATININE 2.68* 2.48* 1.84*  CALCIUM 8.4 8.9 9.1  PHOS  --  4.1 4.2  GLUCOSE 119* 94 97    CBG (last 3)   Recent Labs  04/08/13 0012 04/08/13 0446 04/08/13 0741  GLUCAP 89 99 98    Scheduled Meds: . atenolol  25 mg Oral BID  . cefTRIAXone (ROCEPHIN) IVPB 1 gram/50 mL D5W  1 g Intravenous Q24H  . furosemide  40 mg Intravenous Q8H  . ipratropium-albuterol  3 mL Nebulization Q4H WA  . iron polysaccharides  150 mg Oral BID  . pantoprazole  40 mg Oral BID  . polyethylene glycol  17 g Oral Daily  . Vitamin D (Ergocalciferol)  50,000 Units Oral Q7 days    Continuous Infusions: . sodium  chloride 75 mL/hr at 04/09/13 0800    Calieb Lichtman A. Jimmye Norman, RD, LDN Pager: [phone]

## 2013-04-09 NOTE — Progress Notes (Signed)
Subjective: Patient is more alert and awake. He is improving and his renal function also improving.  Objective: Vital signs in last 24 hours: Temp:  [97.6 F (36.4 C)-98.7 F (37.1 C)] 97.9 F (36.6 C) (01/12 0755) Pulse Rate:  [67-93] 77 (01/12 0800) Resp:  [0-24] 20 (01/12 0800) BP: (98-150)/(52-93) 127/88 mmHg (01/12 0800) SpO2:  [88 %-100 %] 98 % (01/12 0800) Weight:  [61.8 kg (136 lb 3.9 oz)] 61.8 kg (136 lb 3.9 oz) (01/12 0500) Weight change: 4.4 kg (9 lb 11.2 oz) Last BM Date:  (unknown)  Intake/Output from previous day: 01/11 0701 - 01/12 0700 In: 1718.8 [I.V.:1668.8; IV Piggyback:50] Out: 1250 [Urine:1250]  PHYSICAL EXAM General appearance: alert, no distress and an comfortable Resp: diminished breath sounds bilaterally and rhonchi bilaterally Cardio: S1, S2 normal GI: soft, non-tender; bowel sounds normal; no masses,  no organomegaly Extremities: extremities normal, atraumatic, no cyanosis or edema  Lab Results:    @labtest @ [facility]  Recent Labs  04/07/13 0612  PHART 7.383  PO2ART 85.2  TCO2 23.0  HCO3 24.6*   CULTURES Recent Results (from the past 240 hour(s))  URINE CULTURE     Status: None   Collection Time    04/05/13  8:21 AM      Result Value Range Status   Specimen Description URINE, CATHETERIZED   Final   Special Requests NONE   Final   Culture  Setup Time     Final   Value: 04/05/2013 14:37     Performed at [place]     Final   Value: NO GROWTH     Performed at [facility]   Culture     Final   Value: NO GROWTH     Performed at [facility]   Report Status 04/06/2013 FINAL   Final  MRSA PCR SCREENING     Status: None   Collection Time    04/05/13 12:30 PM      Result Value Range Status   MRSA by PCR NEGATIVE  NEGATIVE Final   Comment:            The GeneXpert MRSA Assay (FDA     approved for NASAL specimens     only), is one component of a     comprehensive MRSA colonization   surveillance program. It is not     intended to diagnose MRSA     infection nor to guide or     monitor treatment for     MRSA infections.   Studies/Results: No results found.  Medications: I have reviewed the patient's current medications.  Assesment: Active Problems:   CHF (congestive heart failure)   Elevated troponin   CAD (coronary artery disease) 1. Acute Ventilatory dependent respiratory failure  And s/p extubation 2. Pulmonary edema  3. CAD and S/P CABG  4. Acute systolic CHF  5. COPD  6. DM type II  7. Hypertension  8. Hyperlipidemia  9. GERD 10. Anemia 11. Acute on chronic renal failure  Plan: Medications reviewed Will change medications to po Cbc and BMP in am Regular diet.    LOS: 4 days   Jese Comella 04/09/2013, 8:22 AM

## 2013-04-09 NOTE — Progress Notes (Signed)
Subjective: Interval History: Denies any nausea or vomiting. No difficulty in breathing. He offers no complaint Objective: Vital signs in last 24 hours: Temp:  [97.6 F (36.4 C)-98.7 F (37.1 C)] 98.6 F (37 C) (01/12 0400) Pulse Rate:  [67-93] 78 (01/12 0500) Resp:  [13-24] 15 (01/12 0500) BP: (98-150)/(52-93) 117/60 mmHg (01/12 0500) SpO2:  [88 %-100 %] 88 % (01/12 0715) Weight:  [61.8 kg (136 lb 3.9 oz)] 61.8 kg (136 lb 3.9 oz) (01/12 0500) Weight change: 4.4 kg (9 lb 11.2 oz)  Intake/Output from previous day: 01/11 0701 - 01/12 0700 In: 1493.8 [I.V.:1443.8; IV Piggyback:50] Out: 1250 [Urine:1250] Intake/Output this shift:    Patient is awake. He is not any apparent distress Chest has some expiratory wheezing His heart exam regular rate and rhythm Abdomen soft positive bowel sounds Extremities no edema. Lab Results:  Recent Labs  04/07/13 0544 04/09/13 0527  WBC 13.7* 11.8*  HGB 10.2* 10.6*  HCT 33.9* 33.2*  PLT 156 208   BMET:   Recent Labs  04/07/13 0544 04/09/13 0527  NA 144 142  K 4.5 4.1  CL 106 103  CO2 25 25  GLUCOSE 94 97  BUN 35* 47*  CREATININE 2.48* 1.84*  CALCIUM 8.9 9.1   No results found for this basename: PTH,  in the last 72 hours Iron Studies:   Recent Labs  04/07/13 0544  IRON 74  TIBC 339  FERRITIN 34    Studies/Results: No results found.  I have reviewed the patient's current medications.  Assessment/Plan: Problem #1 acute kidney injury: His BUN and creatinine is improving. Presently patient is none oliguric. Problem #2 respiratory failure: Patient seems to be more awake today. Problem #3 CHF is on Lasix. Patient has about 1.2  L of urine and chest x-ray show some improvement. Problem #4 anemia:. Patient iron saturation and ferritin is low hence iron deficiency anemia. Problem #5 metabolic bone disease his calcium and phosphorus is with  in acceptable range Problem #6 history of hypertension Problem #7 coronary artery  disease  Plan: We'll start patient in ns at 75 cc/hr We will start on nu-iron 150 mg po once aday We'll check his basic metabolic panel and CBC in the morning.    LOS: 4 days   Pedro Oldenburg S 04/09/2013,7:44 AM

## 2013-04-09 NOTE — Progress Notes (Signed)
PT TRANSFERRING TO EGB151. PT ALERT AND ORIENTED. O2 SAT 96% ON ROOM AIR. LUNGS CLEAR. VSS. IV NSL PATENT.DENIES ANY DISTRESS. FAMILY AT BEDSIDE. TRANSFER REPORT CALLED TO VAL RN ON 300.

## 2013-04-09 NOTE — Progress Notes (Signed)
Subjective: He says he feels okay. He has no new complaints. His breathing is doing well  Objective: Vital signs in last 24 hours: Temp:  [97.6 F (36.4 C)-98.7 F (37.1 C)] 97.9 F (36.6 C) (01/12 0755) Pulse Rate:  [67-93] 78 (01/12 0500) Resp:  [13-24] 15 (01/12 0500) BP: (98-150)/(52-93) 117/60 mmHg (01/12 0500) SpO2:  [88 %-100 %] 88 % (01/12 0715) Weight:  [61.8 kg (136 lb 3.9 oz)] 61.8 kg (136 lb 3.9 oz) (01/12 0500) Weight change: 4.4 kg (9 lb 11.2 oz) Last BM Date:  (prior to admission)  Intake/Output from previous day: 01/11 0701 - 01/12 0700 In: 1643.8 [I.V.:1593.8; IV Piggyback:50] Out: 1250 [Urine:1250]  PHYSICAL EXAM General appearance: alert and no distress Resp: clear to auscultation bilaterally Cardio: regular rate and rhythm, S1, S2 normal, no murmur, click, rub or gallop GI: soft, non-tender; bowel sounds normal; no masses,  no organomegaly Extremities: extremities normal, atraumatic, no cyanosis or edema  Lab Results:    Basic Metabolic Panel:  Recent Labs  04/07/13 0544 04/09/13 0527  NA 144 142  K 4.5 4.1  CL 106 103  CO2 25 25  GLUCOSE 94 97  BUN 35* 47*  CREATININE 2.48* 1.84*  CALCIUM 8.9 9.1  PHOS 4.1 4.2   Liver Function Tests: No results found for this basename: AST, ALT, ALKPHOS, BILITOT, PROT, ALBUMIN,  in the last 72 hours No results found for this basename: LIPASE, AMYLASE,  in the last 72 hours No results found for this basename: AMMONIA,  in the last 72 hours CBC:  Recent Labs  04/07/13 0544 04/09/13 0527  WBC 13.7* 11.8*  NEUTROABS  --  8.0*  HGB 10.2* 10.6*  HCT 33.9* 33.2*  MCV 73.4* 73.3*  PLT 156 208   Cardiac Enzymes:  Recent Labs  04/06/13 0907 04/06/13 1427 04/06/13 2112  TROPONINI 0.38* 0.35* <0.30   BNP: No results found for this basename: PROBNP,  in the last 72 hours D-Dimer: No results found for this basename: DDIMER,  in the last 72 hours CBG:  Recent Labs  04/07/13 1140 04/07/13 1625  04/07/13 1953 04/08/13 0012 04/08/13 0446 04/08/13 0741  GLUCAP 111* 98 85 89 99 98   Hemoglobin A1C: No results found for this basename: HGBA1C,  in the last 72 hours Fasting Lipid Panel: No results found for this basename: CHOL, HDL, LDLCALC, TRIG, CHOLHDL, LDLDIRECT,  in the last 72 hours Thyroid Function Tests: No results found for this basename: TSH, T4TOTAL, FREET4, T3FREE, THYROIDAB,  in the last 72 hours Anemia Panel:  Recent Labs  04/07/13 0544  FERRITIN 34  TIBC 339  IRON 74   Coagulation:  Recent Labs  04/06/13 0907  LABPROT 13.9  INR 1.09   Urine Drug Screen: Drugs of Abuse  No results found for this basename: labopia, cocainscrnur, labbenz, amphetmu, thcu, labbarb    Alcohol Level: No results found for this basename: ETH,  in the last 72 hours Urinalysis: No results found for this basename: COLORURINE, APPERANCEUR, LABSPEC, PHURINE, GLUCOSEU, HGBUR, BILIRUBINUR, KETONESUR, PROTEINUR, UROBILINOGEN, NITRITE, LEUKOCYTESUR,  in the last 72 hours Misc. Labs:  ABGS  Recent Labs  04/07/13 0612  PHART 7.383  PO2ART 85.2  TCO2 23.0  HCO3 24.6*   CULTURES Recent Results (from the past 240 hour(s))  URINE CULTURE     Status: None   Collection Time    04/05/13  8:21 AM      Result Value Range Status   Specimen Description URINE, CATHETERIZED   Final  Special Requests NONE   Final   Culture  Setup Time     Final   Value: 04/05/2013 14:37     Performed at [place]     Final   Value: NO GROWTH     Performed at [facility]   Culture     Final   Value: NO GROWTH     Performed at [facility]   Report Status 04/06/2013 FINAL   Final  MRSA PCR SCREENING     Status: None   Collection Time    04/05/13 12:30 PM      Result Value Range Status   MRSA by PCR NEGATIVE  NEGATIVE Final   Comment:            The GeneXpert MRSA Assay (FDA     approved for NASAL specimens     only), is one component of a      comprehensive MRSA colonization     surveillance program. It is not     intended to diagnose MRSA     infection nor to guide or     monitor treatment for     MRSA infections.   Studies/Results: No results found.  Medications:  Prior to Admission:  Prescriptions prior to admission  Medication Sig Dispense Refill  . atenolol (TENORMIN) 25 MG tablet Take 25 mg by mouth 2 (two) times daily.       Scheduled: . atenolol  25 mg Oral BID  . cefTRIAXone (ROCEPHIN) IVPB 1 gram/50 mL D5W  1 g Intravenous Q24H  . furosemide  40 mg Intravenous Q8H  . ipratropium-albuterol  3 mL Nebulization Q4H WA  . iron polysaccharides  150 mg Oral BID  . pantoprazole  40 mg Oral BID  . polyethylene glycol  17 g Oral Daily  . Vitamin D (Ergocalciferol)  50,000 Units Oral Q7 days   Continuous: . sodium chloride 75 mL/hr at 04/09/13 0439   PRN:  Assesment: He was admitted with acute ventilator dependent respiratory failure that seems to be related to pulmonary edema and congestive heart failure. He is markedly improved. He has some wheezing earlier but I do not hear any wheezing now Active Problems:   CHF (congestive heart failure)   Elevated troponin   CAD (coronary artery disease)    Plan: I will plan to sign off now. Thanks for allowing me to see him with you    LOS: 4 days   Efren Kross L 04/09/2013, 8:03 AM

## 2013-04-10 LAB — CBC
HCT: 32.8 % — ABNORMAL LOW (ref 39.0–52.0)
Hemoglobin: 10.3 g/dL — ABNORMAL LOW (ref 13.0–17.0)
MCH: 23.3 pg — ABNORMAL LOW (ref 26.0–34.0)
MCHC: 31.4 g/dL (ref 30.0–36.0)
MCV: 74 fL — AB (ref 78.0–100.0)
Platelets: 175 10*3/uL (ref 150–400)
RBC: 4.43 MIL/uL (ref 4.22–5.81)
RDW: 19.4 % — AB (ref 11.5–15.5)
WBC: 11.7 10*3/uL — AB (ref 4.0–10.5)

## 2013-04-10 LAB — BASIC METABOLIC PANEL
BUN: 50 mg/dL — ABNORMAL HIGH (ref 6–23)
CO2: 24 meq/L (ref 19–32)
CREATININE: 1.92 mg/dL — AB (ref 0.50–1.35)
Calcium: 9.1 mg/dL (ref 8.4–10.5)
Chloride: 105 mEq/L (ref 96–112)
GFR calc Af Amer: 40 mL/min — ABNORMAL LOW (ref >90)
GFR calc non Af Amer: 34 mL/min — ABNORMAL LOW (ref >90)
Glucose, Bld: 90 mg/dL (ref 70–99)
Potassium: 3.8 mEq/L (ref 3.7–5.3)
Sodium: 142 mEq/L (ref 137–147)

## 2013-04-10 MED ORDER — POTASSIUM CHLORIDE ER 10 MEQ PO TBCR: 20.0000 meq | EXTENDED_RELEASE_TABLET | Freq: Every day | ORAL | Status: AC

## 2013-04-10 MED ORDER — AMOXICILLIN-POT CLAVULANATE 500-125 MG PO TABS: 1.0000 | ORAL_TABLET | Freq: Three times a day (TID) | ORAL | Status: AC

## 2013-04-10 MED ORDER — PANTOPRAZOLE SODIUM 40 MG PO TBEC: 40.0000 mg | DELAYED_RELEASE_TABLET | Freq: Two times a day (BID) | ORAL | Status: AC

## 2013-04-10 MED ORDER — FUROSEMIDE 20 MG PO TABS: 20.0000 mg | ORAL_TABLET | Freq: Every day | ORAL | Status: AC

## 2013-04-10 MED ORDER — VITAMIN D (ERGOCALCIFEROL) 1.25 MG (50000 UNIT) PO CAPS: 50000.0000 [IU] | ORAL_CAPSULE | ORAL | Status: AC

## 2013-04-10 MED ORDER — POLYSACCHARIDE IRON COMPLEX 150 MG PO CAPS: 150.0000 mg | ORAL_CAPSULE | Freq: Two times a day (BID) | ORAL | Status: AC

## 2013-04-10 NOTE — Progress Notes (Signed)
[name]  MRN: [id number]  DOB/AGE: 1945/12/05 68 y.o.  Primary Care Physician:FANTA,TESFAYE, MD  Admit date: 04/05/2013  Chief Complaint:  Chief Complaint  Patient presents with  . Respiratory Distress    S-Pt presented on  04/05/2013 with  Chief Complaint  Patient presents with  . Respiratory Distress  .    Pt today feels better.    Pt offers no new complaints .  Meds . atenolol  25 mg Oral BID  . cefTRIAXone (ROCEPHIN) IVPB 1 gram/50 mL D5W  1 g Intravenous Q24H  . furosemide  40 mg Intravenous Q8H  . ipratropium-albuterol  3 mL Nebulization Q4H WA  . iron polysaccharides  150 mg Oral BID  . pantoprazole  40 mg Oral BID  . polyethylene glycol  17 g Oral Daily  . Vitamin D (Ergocalciferol)  50,000 Units Oral Q7 days      Physical Exam: Vital signs in last 24 hours: Temp:  [98.1 F (36.7 C)] 98.1 F (36.7 C) (01/13 0611) Pulse Rate:  [57-69] 69 (01/13 0611) Resp:  [15-18] 18 (01/13 0611) BP: (111-128)/(49-84) 128/84 mmHg (01/13 0611) SpO2:  [95 %-100 %] 100 % (01/13 1107) Weight change:  Last BM Date:  (unknown)  Intake/Output from previous day: 01/12 0701 - 01/13 0700 In: 1925 [P.O.:1200; I.V.:675; IV Piggyback:50] Out: 2000 [Urine:2000]     Physical Exam: General- pt is awake,alert, oriented to time place and person Resp- No acute REsp distress, CTA B/L NO Rhonchi CVS- S1S2 regular in rate and rhythm GIT- BS+, soft, NT, ND EXT- NO LE Edema, Cyanosis   Lab Results: CBC  Recent Labs  04/09/13 0527 04/10/13 0630  WBC 11.8* 11.7*  HGB 10.6* 10.3*  HCT 33.2* 32.8*  PLT 208 175    BMET  Recent Labs  04/09/13 0527 04/10/13 0630  NA 142 142  K 4.1 3.8  CL 103 105  CO2 25 24  GLUCOSE 97 90  BUN 47* 50*  CREATININE 1.84* 1.92*  CALCIUM 9.1 9.1   Trend Creat 2015   2.68=>1.84--1.92 2013   1.54--1.90  MICRO Recent Results (from the past 240 hour(s))  URINE CULTURE     Status: None   Collection Time    04/05/13  8:21 AM       Result Value Range Status   Specimen Description URINE, CATHETERIZED   Final   Special Requests NONE   Final   Culture  Setup Time     Final   Value: 04/05/2013 14:37     Performed at [facility]     Final   Value: NO GROWTH     Performed at [facility]   Culture     Final   Value: NO GROWTH     Performed at [facility]   Report Status 04/06/2013 FINAL   Final  MRSA PCR SCREENING     Status: None   Collection Time    04/05/13 12:30 PM      Result Value Range Status   MRSA by PCR NEGATIVE  NEGATIVE Final   Comment:            The GeneXpert MRSA Assay (FDA     approved for NASAL specimens     only), is one component of a     comprehensive MRSA colonization     surveillance program. It is not     intended to diagnose MRSA     infection nor to guide or  monitor treatment for     MRSA infections.      Lab Results  Component Value Date   PTH 606.3* 04/06/2013   CALCIUM 9.1 04/10/2013   PHOS 4.2 04/09/2013               Impression: 1)Renal  AKI secondary to Prerenal/ATN                AKI on CKD               AKI now better               Creat near to baseline               CKD stage 3 .               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               2)HTN Target Organ damage  CKD CHF  Medication- On Diuretics- On Beta blockers    3)Anemia HGb at goal (9--11) Had UGI bleed Iron defi anemia-GI work up done EGD showed gastrtits   4)CKD Mineral-Bone Disorder PTH elevated. Secondary Hyperparathyroidism  Present. Phosphorus at goal. Vitamin 25-OH low.    NO REplacemet  5)Resp-admitted with resp failure Now much better  PMD following  6)Electrolytes Normokalemic NOrmonatremic   7)Acid base Co2 at goal   8) CHF- ON Diurtics  Plan:  Will continue current care     [place]  S 04/10/2013, 3:06 PM

## 2013-04-10 NOTE — Progress Notes (Signed)
Patient with orders to be discharge home. Discharge instructions given, patient verbalized understanding. Patient stable. Patient left with brother in private vehicle.

## 2013-04-10 NOTE — Discharge Summary (Signed)
Physician Discharge Summary  Patient ID: [name] MRN: [id number] DOB/AGE: 69-Apr-1947 69 y.o. Primary Care Physician:Anahlia Iseminger, MD Admit date: 04/05/2013 Discharge date: 04/10/2013    Dis. 1. Acute Ventilatory dependent respiratory failure And s/p extubation  2. Pulmonary edema  3. CAD and S/P CABG  4. Acute systolic CHF  5. COPD  6. DM type II  7. Hypertension  8. Hyperlipidemia  9. GERD  10. Anemia  11. Acute on chronic renal failure charge Diagnoses:   Active Problems:   CHF (congestive heart failure)   Elevated troponin   CAD (coronary artery disease)    Medication List         amoxicillin-clavulanate 500-125 MG per tablet  Commonly known as:  AUGMENTIN  Take 1 tablet (500 mg total) by mouth 3 (three) times daily.     atenolol 25 MG tablet  Commonly known as:  TENORMIN  Take 25 mg by mouth 2 (two) times daily.     furosemide 20 MG tablet  Commonly known as:  LASIX  Take 1 tablet (20 mg total) by mouth daily.     iron polysaccharides 150 MG capsule  Commonly known as:  NIFEREX  Take 1 capsule (150 mg total) by mouth 2 (two) times daily.     pantoprazole 40 MG tablet  Commonly known as:  PROTONIX  Take 1 tablet (40 mg total) by mouth 2 (two) times daily.     potassium chloride 10 MEQ tablet  Commonly known as:  K-DUR  Take 2 tablets (20 mEq total) by mouth daily.     Vitamin D (Ergocalciferol) 50000 UNITS Caps capsule  Commonly known as:  DRISDOL  Take 1 capsule (50,000 Units total) by mouth every 7 (seven) days.        Discharged Condition:  improved    Consults: pulmonary, GI, cardiology and nephrology  Significant Diagnostic Studies: Dg Abd 1 View  04/06/2013   CLINICAL DATA:  68 year old male status post NG tube placement. Initial encounter.  EXAM: ABDOMEN - 1 VIEW  COMPARISON:  Chest radiographs 0616 hr the same day.  FINDINGS: AP view at 1235 hrs. Enteric tube is been advanced. Side hole now projects at the level of the gastric  fundus. Non obstructed bowel gas pattern. Right upper quadrant surgical clips. Extensive Aortoiliac calcified atherosclerosis noted. Foley catheter or rectal catheter/temperature probe projects over the midline lower pelvis.  IMPRESSION: NG tube in good position, side hole the level of the gastric fundus.   Electronically Signed   By: Lars Pinks M.D.   On: 04/06/2013 13:30   Ct Head Wo Contrast  04/06/2013   CLINICAL DATA:  Altered level of consciousness.  Ventilated patient.  EXAM: CT HEAD WITHOUT CONTRAST  TECHNIQUE: Contiguous axial images were obtained from the base of the skull through the vertex without intravenous contrast.  COMPARISON:  None.  FINDINGS: The ventricles are normal in configuration. There is age related ventricular and sulcal enlargement. No hydrocephalus.  There are no parenchymal masses or mass effect. There is patchy white matter hypoattenuation most consistent with mild chronic microvascular ischemic change there is no evidence of a cortical infarct.  There are no extra-axial masses or abnormal fluid collections.  There is no intracranial hemorrhage.  The visualized sinuses and mastoid air cells are clear. No skull lesion.  There are dense calcifications along the cavernous portions of the internal carotid arteries.  IMPRESSION: 1. No acute findings. 2. Age related volume loss. Mild chronic microvascular ischemic change.   Electronically Signed  By: Lajean Manes M.D.   On: 04/06/2013 13:55   Portable Chest Xray In Am  04/06/2013   CLINICAL DATA:  Respiratory failure.  Hypertension.  Hyperlipidemia.  EXAM: PORTABLE CHEST - 1 VIEW  COMPARISON:  DG CHEST 1V PORT dated 04/05/2013; DG CHEST 1V PORT dated 04/05/2013  FINDINGS: Endotracheal tube 4.1 cm above carina. Nasogastric tube terminates at the lower thoracic esophagus.  Cardiomegaly accentuated by AP portable technique. Atherosclerosis in the transverse aorta. Small bilateral pleural effusions. Right apex excluded. No pneumothorax.  Improvement in right greater than left interstitial and lower lobe predominant airspace disease. Suspect underlying COPD.  IMPRESSION: Nasogastric tube which terminates at the low thoracic esophagus. This should be advanced. These results will be called to the ordering clinician or representative by the Radiologist Assistant, and communication documented in the PACS Dashboard.  Improved aeration with edema and/or infection persisting. Greater on the right.  Small bilateral pleural effusions.   Electronically Signed   By: Abigail Miyamoto M.D.   On: 04/06/2013 07:42   Dg Chest Port 1 View  04/05/2013   CLINICAL DATA:  Intubation  EXAM: PORTABLE CHEST - 1 VIEW  COMPARISON:  04/05/2013  FINDINGS: Endotracheal tube in satisfactory position. NG tube is in place with the tip not visualized.  Severe bilateral airspace disease is unchanged and most consistent with edema.  IMPRESSION: Endotracheal tube in satisfactory position  No change in severe bilateral edema.   Electronically Signed   By: Franchot Gallo M.D.   On: 04/05/2013 07:25   Dg Chest Port 1 View  04/05/2013   CLINICAL DATA:  Respiratory distress.  History of CABG  EXAM: PORTABLE CHEST - 1 VIEW  COMPARISON:  11/13/2011  FINDINGS: Interstitial and alveolar edema. Small bilateral pleural effusions. Background COPD with chronic lung hyperinflation. Cardiomegaly, status post CABG.  IMPRESSION: CHF.   Electronically Signed   By: Jorje Guild M.D.   On: 04/05/2013 07:04    Lab Results: Basic Metabolic Panel:  Recent Labs  04/09/13 0527 04/10/13 0630  NA 142 142  K 4.1 3.8  CL 103 105  CO2 25 24  GLUCOSE 97 90  BUN 47* 50*  CREATININE 1.84* 1.92*  CALCIUM 9.1 9.1  PHOS 4.2  --    Liver Function Tests: No results found for this basename: AST, ALT, ALKPHOS, BILITOT, PROT, ALBUMIN,  in the last 72 hours   CBC:  Recent Labs  04/09/13 0527 04/10/13 0630  WBC 11.8* 11.7*  NEUTROABS 8.0*  --   HGB 10.6* 10.3*  HCT 33.2* 32.8*  MCV 73.3*  74.0*  PLT 208 175    Recent Results (from the past 240 hour(s))  URINE CULTURE     Status: None   Collection Time    04/05/13  8:21 AM      Result Value Range Status   Specimen Description URINE, CATHETERIZED   Final   Special Requests NONE   Final   Culture  Setup Time     Final   Value: 04/05/2013 14:37     Performed at [facility]     Final   Value: NO GROWTH     Performed at [facility]   Culture     Final   Value: NO GROWTH     Performed at [facility]   Report Status 04/06/2013 FINAL   Final  MRSA PCR SCREENING     Status: None   Collection Time    04/05/13 12:30  PM      Result Value Range Status   MRSA by PCR NEGATIVE  NEGATIVE Final   Comment:            The GeneXpert MRSA Assay (FDA     approved for NASAL specimens     only), is one component of a     comprehensive MRSA colonization     surveillance program. It is not     intended to diagnose MRSA     infection nor to guide or     monitor treatment for     MRSA infections.     Hospital Course:  This is a 68 years old male patient who was admitted due to shortness of breath. He was intubated and was admitted to ICU. Patient was diuresed. He developed also GI bleed requiring transfusion 2 units. He was seen by GI and EGD was done. He was also followed by nephrology for acute renal injury. Patient was successfully extubated. Over all he improved and discharged in stable condition to be followed in out patient.  Discharge Exam: Blood pressure 128/84, pulse 69, temperature 98.1 F (36.7 C), temperature source Oral, resp. rate 18, height 5\' 10"  (1.778 m), weight 61.8 kg (136 lb 3.9 oz), SpO2 99.00%.   Disposition:  Home        Follow-up Information   Follow up with [facility], MD In 1 week.   Specialty:  Internal Medicine   Contact information:   [place] [address] [phone]       Signed: Rosita Fire  04/10/2013, 8:27  AM

## 2013-10-27 DEATH — deceased

## 2014-12-04 ENCOUNTER — Encounter (INDEPENDENT_AMBULATORY_CARE_PROVIDER_SITE_OTHER): Payer: Self-pay | Admitting: *Deleted

## 2015-08-21 IMAGING — CT CT HEAD W/O CM
1 of 2 series · 15 of 30 positions shown, 19 images · non-contrast
Comparison: None.

CLINICAL DATA: Altered level of consciousness.  Ventilated patient.

EXAM:
CT HEAD WITHOUT CONTRAST
TECHNIQUE: Contiguous axial images were obtained from the base of the skull
through the vertex without intravenous contrast.

[Series 3: headtrauma 2.4 h60s · axial · 0.46mm/px · z∈[+78,+233]mm · 15 of 70 slices shown, 19 images]
[im 4/70  brain]
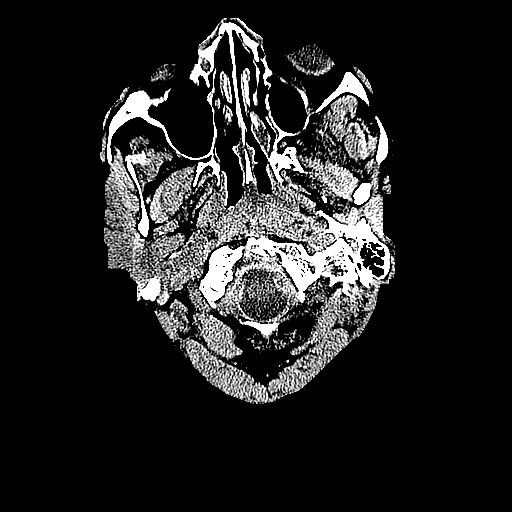
[im 4/70  bone]
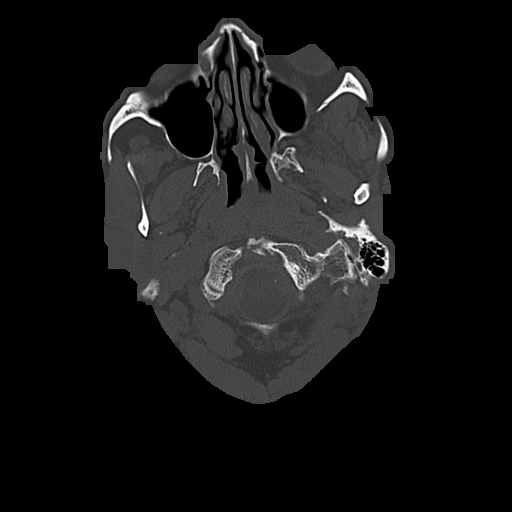
[im 8/70  brain]
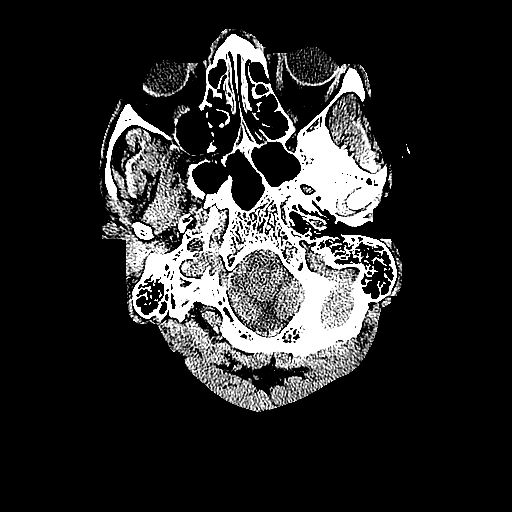
[im 15/70  brain]
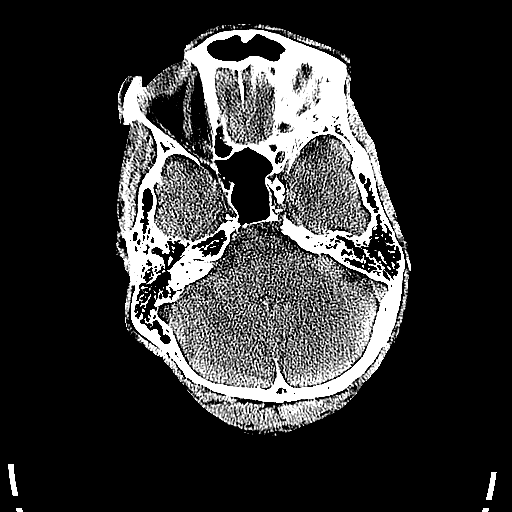
[im 19/70  brain]
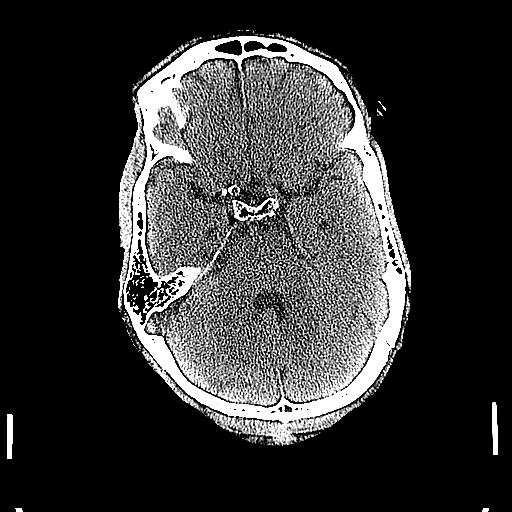
[im 22/70  brain]
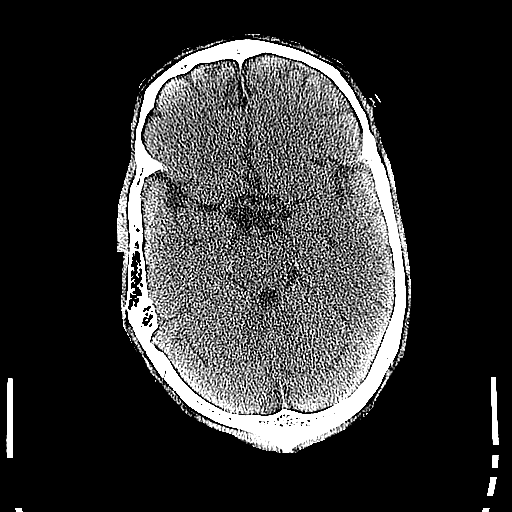
[im 22/70  bone]
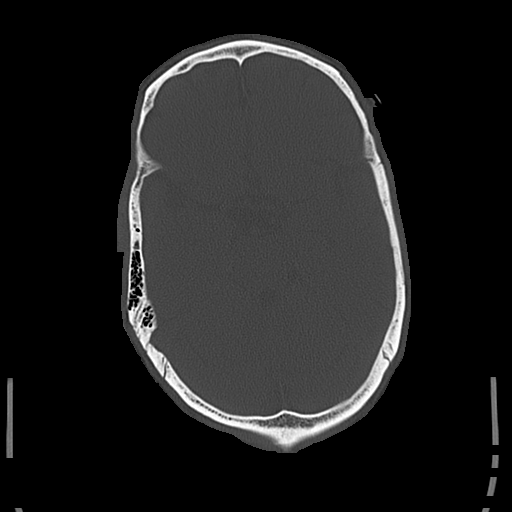
[im 26/70  brain]
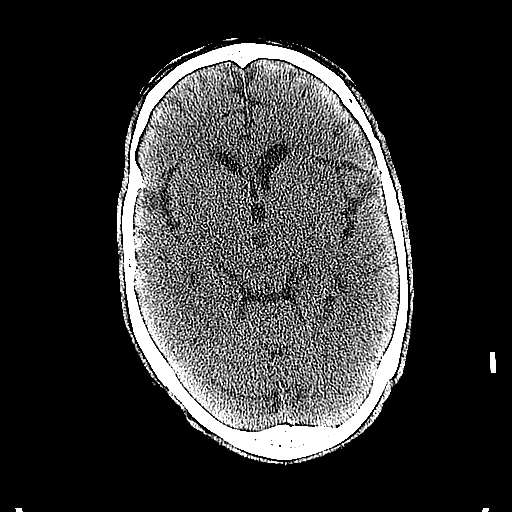
[im 30/70  brain]
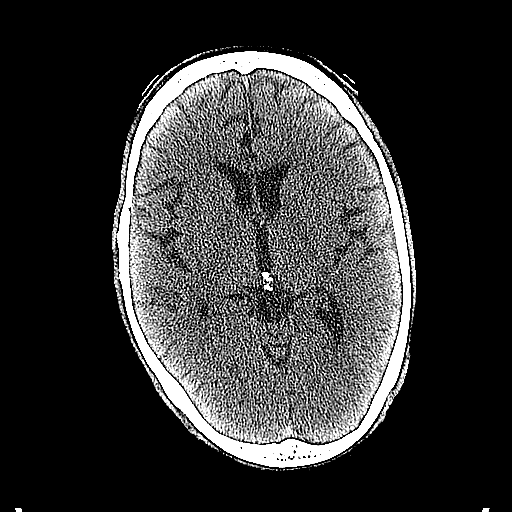
[im 37/70  brain]
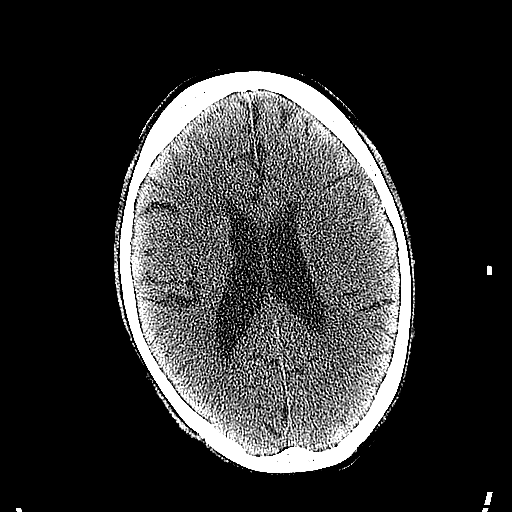
[im 40/70  brain]
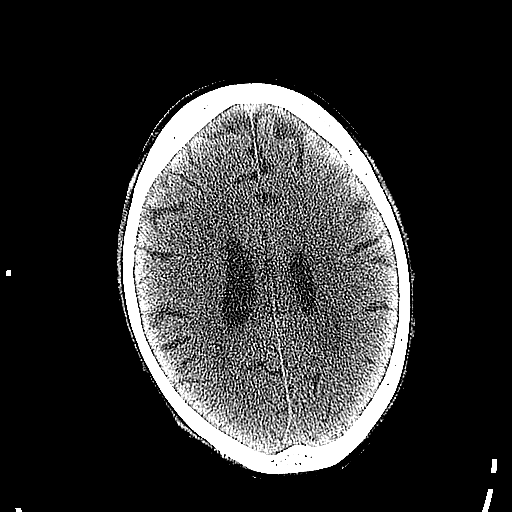
[im 40/70  bone]
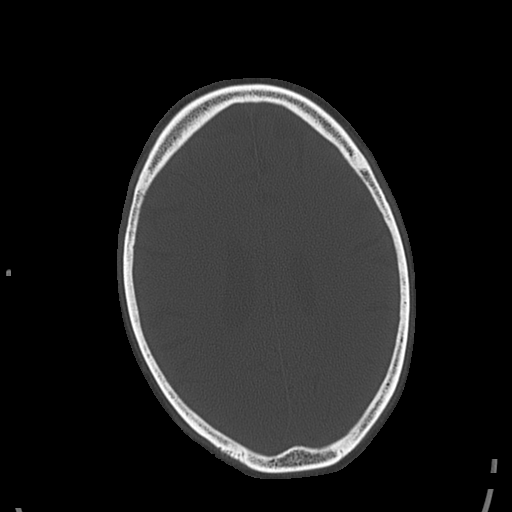
[im 44/70  brain]
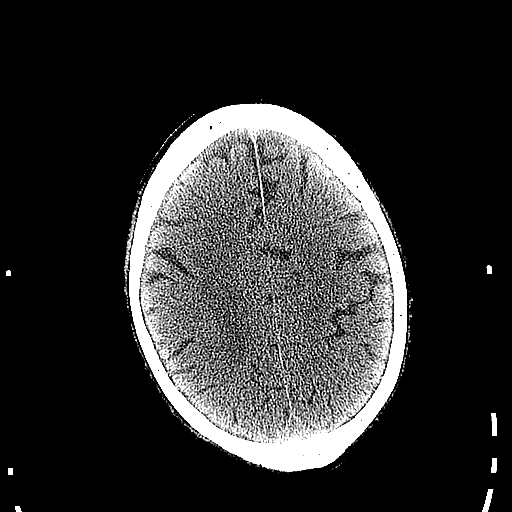
[im 48/70  brain]
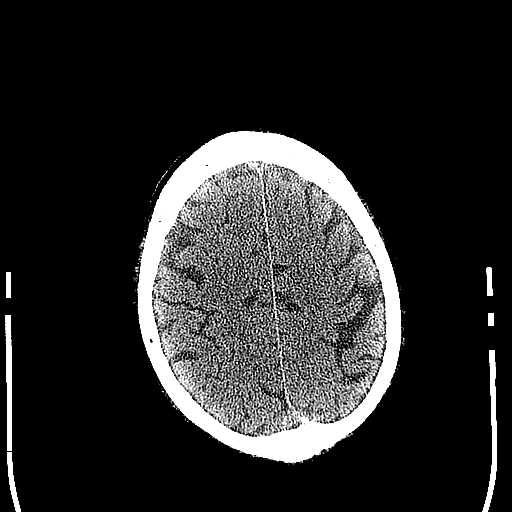
[im 51/70  brain]
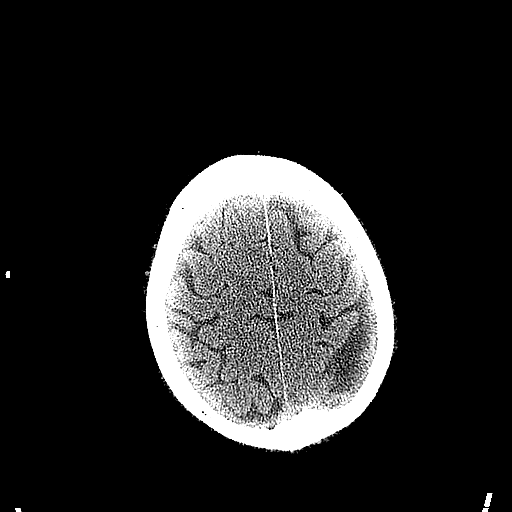
[im 59/70  brain]
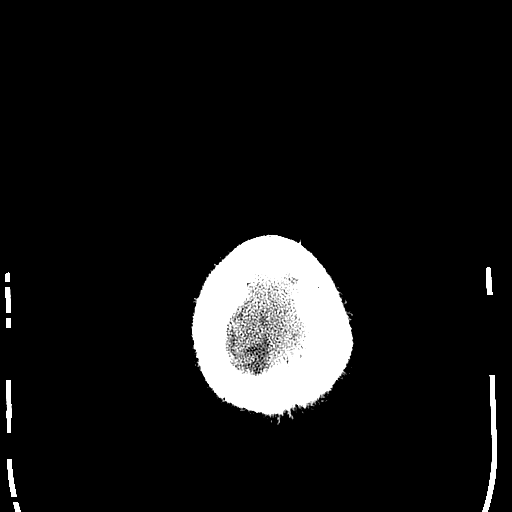
[im 59/70  bone]
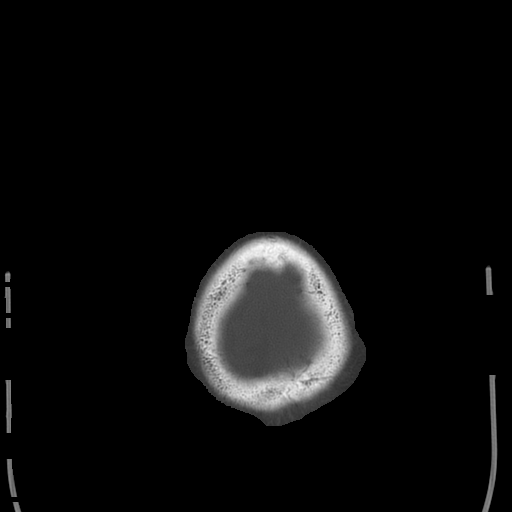
[im 62/70  brain]
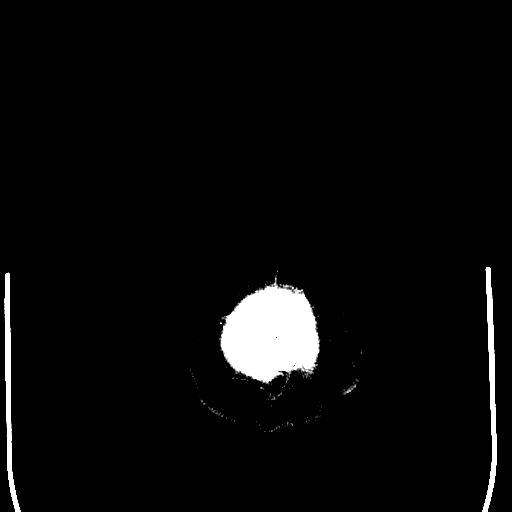
[im 66/70  brain]
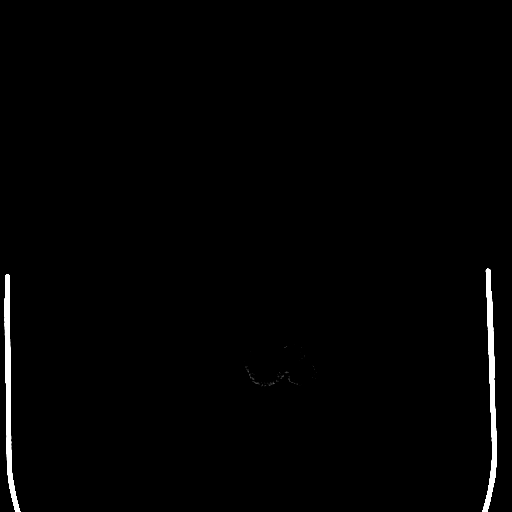

[15 of 30 positions shown; findings below may reference images not displayed]

FINDINGS: The ventricles are normal in configuration. There is age related
ventricular and sulcal enlargement. No hydrocephalus.

There are no parenchymal masses or mass effect. There is patchy
white matter hypoattenuation most consistent with mild chronic
microvascular ischemic change there is no evidence of a cortical
infarct.

There are no extra-axial masses or abnormal fluid collections.

There is no intracranial hemorrhage.

The visualized sinuses and mastoid air cells are clear. No skull
lesion.

There are dense calcifications along the cavernous portions of the
internal carotid arteries.
IMPRESSION: 1. No acute findings.
2. Age related volume loss. Mild chronic microvascular ischemic
change.
